# Patient Record
Sex: Female | Born: 1935
Health system: Southern US, Community
[De-identification: ages and names within clinical notes are randomized; demographics above are authoritative.]

## PROBLEM LIST (undated history)

## (undated) DIAGNOSIS — G309 Alzheimer's disease, unspecified: Secondary | ICD-10-CM

## (undated) DIAGNOSIS — F0281 Dementia in other diseases classified elsewhere with behavioral disturbance: Secondary | ICD-10-CM

## (undated) DIAGNOSIS — F419 Anxiety disorder, unspecified: Secondary | ICD-10-CM

## (undated) DIAGNOSIS — N182 Chronic kidney disease, stage 2 (mild): Secondary | ICD-10-CM

## (undated) DIAGNOSIS — F0390 Unspecified dementia without behavioral disturbance: Secondary | ICD-10-CM

## (undated) DIAGNOSIS — K529 Noninfective gastroenteritis and colitis, unspecified: Secondary | ICD-10-CM

## (undated) DIAGNOSIS — I10 Essential (primary) hypertension: Secondary | ICD-10-CM

## (undated) DIAGNOSIS — F02818 Dementia in other diseases classified elsewhere, unspecified severity, with other behavioral disturbance: Secondary | ICD-10-CM

## (undated) DIAGNOSIS — K635 Polyp of colon: Secondary | ICD-10-CM

## (undated) HISTORY — DX: Alzheimer's disease, unspecified: G30.9

## (undated) HISTORY — DX: Dementia in other diseases classified elsewhere with behavioral disturbance: F02.81

## (undated) HISTORY — DX: Dementia in other diseases classified elsewhere, unspecified severity, with other behavioral disturbance: F02.818

## (undated) HISTORY — DX: Chronic kidney disease, stage 2 (mild): N18.2

## (undated) HISTORY — DX: Unspecified dementia, unspecified severity, without behavioral disturbance, psychotic disturbance, mood disturbance, and anxiety: F03.90

## (undated) HISTORY — DX: Noninfective gastroenteritis and colitis, unspecified: K52.9

## (undated) HISTORY — DX: Anxiety disorder, unspecified: F41.9

## (undated) HISTORY — PX: TONSILLECTOMY: SUR1361

## (undated) HISTORY — DX: Polyp of colon: K63.5

## (undated) HISTORY — DX: Essential (primary) hypertension: I10

---

## 2015-05-03 DIAGNOSIS — M1711 Unilateral primary osteoarthritis, right knee: Secondary | ICD-10-CM | POA: Diagnosis not present

## 2015-07-12 DIAGNOSIS — H35371 Puckering of macula, right eye: Secondary | ICD-10-CM | POA: Diagnosis not present

## 2015-07-12 DIAGNOSIS — Z961 Presence of intraocular lens: Secondary | ICD-10-CM | POA: Diagnosis not present

## 2015-07-12 DIAGNOSIS — H2512 Age-related nuclear cataract, left eye: Secondary | ICD-10-CM | POA: Diagnosis not present

## 2015-07-12 DIAGNOSIS — H401232 Low-tension glaucoma, bilateral, moderate stage: Secondary | ICD-10-CM | POA: Diagnosis not present

## 2015-09-13 DIAGNOSIS — H2512 Age-related nuclear cataract, left eye: Secondary | ICD-10-CM | POA: Diagnosis not present

## 2015-09-13 DIAGNOSIS — Z961 Presence of intraocular lens: Secondary | ICD-10-CM | POA: Diagnosis not present

## 2015-09-13 DIAGNOSIS — H401232 Low-tension glaucoma, bilateral, moderate stage: Secondary | ICD-10-CM | POA: Diagnosis not present

## 2015-11-10 DIAGNOSIS — M1711 Unilateral primary osteoarthritis, right knee: Secondary | ICD-10-CM | POA: Diagnosis not present

## 2016-01-23 ENCOUNTER — Encounter: Payer: Self-pay | Admitting: Family Medicine

## 2016-01-23 ENCOUNTER — Ambulatory Visit (INDEPENDENT_AMBULATORY_CARE_PROVIDER_SITE_OTHER): Payer: Medicare Other | Admitting: Family Medicine

## 2016-01-23 VITALS — BP 130/80 | HR 71 | Wt 192.4 lb

## 2016-01-23 DIAGNOSIS — J3489 Other specified disorders of nose and nasal sinuses: Secondary | ICD-10-CM | POA: Insufficient documentation

## 2016-01-23 DIAGNOSIS — R197 Diarrhea, unspecified: Secondary | ICD-10-CM | POA: Diagnosis not present

## 2016-01-23 DIAGNOSIS — F0280 Dementia in other diseases classified elsewhere without behavioral disturbance: Secondary | ICD-10-CM | POA: Diagnosis not present

## 2016-01-23 DIAGNOSIS — K529 Noninfective gastroenteritis and colitis, unspecified: Secondary | ICD-10-CM | POA: Insufficient documentation

## 2016-01-23 DIAGNOSIS — I10 Essential (primary) hypertension: Secondary | ICD-10-CM | POA: Insufficient documentation

## 2016-01-23 DIAGNOSIS — F0390 Unspecified dementia without behavioral disturbance: Secondary | ICD-10-CM | POA: Insufficient documentation

## 2016-01-23 NOTE — Progress Notes (Signed)
Subjective:    Patient ID: Christina Atkinson, female    DOB: 31-Jan-1932, 82 y.o.   MRN: [id number]  HPI Chief Complaint  Patient presents with  . new pt    new pt get established. off and on diarrhea.    She is an 82 year old Caucasian female with a history of HTN and dementia who is brought here by her daughter in law to establish care. No records have been received but they did sign a medical release today. She moved here from [place] in September and has been going to Dr. Nyra Capes in [place]. Her daughter in law is a Marine scientist.  Complains of 2-3 week history of intermittent loose diarrhea. States the diarrhea has occurred on 2 occasions and appears to be related to [place] or certain foods. Has not had diarrhea in several days.  Denies fever, chills, chest pain, palpitations, DOE, abdominal pain, nausea, vomiting, or urinary symptoms.  Complains of clear nasal drainage without congestion ongoing for a couple of weeks.   She was diagnosed with dementia when she was living in [place] per her daughter in law and was wearing some sort of patch for dementia. She thinks she was diagnosed approximately 2-3 years ago.  States she does not think patient has seen a neurologist or had any recent imaging. She is taking daily Aricept prescribed by her previous PCP.   Reviewed allergies, medications, past medical,  and social history.    Review of Systems Pertinent positives and negatives in the history of present illness.     Objective:   Physical Exam  Constitutional: She appears well-developed and well-nourished. No distress.  HENT:  Right Ear: Tympanic membrane and ear canal normal.  Left Ear: Tympanic membrane and ear canal normal.  Nose: Nose normal. Right sinus exhibits no maxillary sinus tenderness and no frontal sinus tenderness. Left sinus exhibits no maxillary sinus tenderness and no frontal sinus tenderness.  Mouth/Throat: Uvula is midline, oropharynx is clear and moist and mucous membranes  are normal.  Neck: Neck supple.  Cardiovascular: Normal rate, regular rhythm and normal heart sounds.  Exam reveals no gallop and no friction rub.   No murmur heard. Pulmonary/Chest: Effort normal and breath sounds normal.  Abdominal: Soft. Normal appearance and bowel sounds are normal. There is no hepatosplenomegaly. There is no tenderness. There is no rebound and no CVA tenderness.  Lymphadenopathy:    She has no cervical adenopathy.       Right: No supraclavicular adenopathy present.       Left: No supraclavicular adenopathy present.  Neurological: She is alert. She has normal strength.  Skin: Skin is warm and dry. No rash noted. No pallor.   BP 130/80   Pulse 71   Wt 192 lb 6.4 oz (87.3 kg)      Assessment & Plan:  Essential hypertension  Dementia associated with other underlying disease without behavioral disturbance  Diarrhea, unspecified type  Rhinorrhea  Discussed that her blood pressure appears to be well controlled and to stay on current medication.  Diarrhea appears to be related to certain foods. She is hydrated and no sign of systemic illness. Exam is normal. Plan to have her pay close attention to triggers for diarrhea if this occurs again.  Rhinorrhea may be early viral illness but no other symptoms at this point. May be having allergy issues. She will let me know if symptoms worsen.  It is not clear as to how she was diagnosed with dementia or what type of  dementia she has at this point. Plan to get medical records from previous PCP.  Plan to refer to neurologist for further evaluation once I have her records.  Will have her follow up in 3 weeks and hope to have her records by then.

## 2016-02-21 DIAGNOSIS — D692 Other nonthrombocytopenic purpura: Secondary | ICD-10-CM | POA: Diagnosis not present

## 2016-02-21 DIAGNOSIS — L570 Actinic keratosis: Secondary | ICD-10-CM | POA: Diagnosis not present

## 2016-02-21 DIAGNOSIS — L853 Xerosis cutis: Secondary | ICD-10-CM | POA: Diagnosis not present

## 2016-02-21 DIAGNOSIS — L905 Scar conditions and fibrosis of skin: Secondary | ICD-10-CM | POA: Diagnosis not present

## 2016-02-21 DIAGNOSIS — L821 Other seborrheic keratosis: Secondary | ICD-10-CM | POA: Diagnosis not present

## 2016-02-21 DIAGNOSIS — D1801 Hemangioma of skin and subcutaneous tissue: Secondary | ICD-10-CM | POA: Diagnosis not present

## 2016-02-21 DIAGNOSIS — L814 Other melanin hyperpigmentation: Secondary | ICD-10-CM | POA: Diagnosis not present

## 2016-02-28 DIAGNOSIS — M1711 Unilateral primary osteoarthritis, right knee: Secondary | ICD-10-CM | POA: Diagnosis not present

## 2016-02-29 ENCOUNTER — Telehealth: Payer: Self-pay

## 2016-02-29 ENCOUNTER — Encounter: Payer: Self-pay | Admitting: Family Medicine

## 2016-02-29 DIAGNOSIS — N182 Chronic kidney disease, stage 2 (mild): Secondary | ICD-10-CM | POA: Insufficient documentation

## 2016-02-29 DIAGNOSIS — F02818 Dementia in other diseases classified elsewhere, unspecified severity, with other behavioral disturbance: Secondary | ICD-10-CM | POA: Insufficient documentation

## 2016-02-29 DIAGNOSIS — F0281 Dementia in other diseases classified elsewhere with behavioral disturbance: Secondary | ICD-10-CM | POA: Insufficient documentation

## 2016-02-29 DIAGNOSIS — G309 Alzheimer's disease, unspecified: Secondary | ICD-10-CM

## 2016-02-29 NOTE — Telephone Encounter (Signed)
Records recvd on pt from [facility] din your folder for review. Christina Atkinson

## 2016-05-05 DIAGNOSIS — J069 Acute upper respiratory infection, unspecified: Secondary | ICD-10-CM | POA: Diagnosis not present

## 2016-05-16 ENCOUNTER — Encounter: Payer: Self-pay | Admitting: Family Medicine

## 2016-05-16 ENCOUNTER — Ambulatory Visit (INDEPENDENT_AMBULATORY_CARE_PROVIDER_SITE_OTHER): Payer: Medicare Other | Admitting: Family Medicine

## 2016-05-16 VITALS — BP 140/80 | HR 65 | Temp 97.7°F | Ht 69.0 in | Wt 183.2 lb

## 2016-05-16 DIAGNOSIS — R4189 Other symptoms and signs involving cognitive functions and awareness: Secondary | ICD-10-CM | POA: Diagnosis not present

## 2016-05-16 DIAGNOSIS — F0281 Dementia in other diseases classified elsewhere with behavioral disturbance: Secondary | ICD-10-CM

## 2016-05-16 DIAGNOSIS — G308 Other Alzheimer's disease: Secondary | ICD-10-CM | POA: Diagnosis not present

## 2016-05-16 DIAGNOSIS — I10 Essential (primary) hypertension: Secondary | ICD-10-CM

## 2016-05-16 DIAGNOSIS — G309 Alzheimer's disease, unspecified: Principal | ICD-10-CM

## 2016-05-16 LAB — CBC WITH DIFFERENTIAL/PLATELET
BASOS PCT: 1 %
Basophils Absolute: 40 cells/uL (ref 0–200)
Eosinophils Absolute: 80 cells/uL (ref 15–500)
Eosinophils Relative: 2 %
HCT: 38.2 % (ref 35.0–45.0)
Hemoglobin: 12.3 g/dL (ref 11.7–15.5)
LYMPHS PCT: 21 %
Lymphs Abs: 840 cells/uL — ABNORMAL LOW (ref 850–3900)
MCH: 29.6 pg (ref 27.0–33.0)
MCHC: 32.2 g/dL (ref 32.0–36.0)
MCV: 91.8 fL (ref 80.0–100.0)
MONO ABS: 480 {cells}/uL (ref 200–950)
MONOS PCT: 12 %
MPV: 11.1 fL (ref 7.5–12.5)
NEUTROS ABS: 2560 {cells}/uL (ref 1500–7800)
Neutrophils Relative %: 64 %
PLATELETS: 218 10*3/uL (ref 140–400)
RBC: 4.16 MIL/uL (ref 3.80–5.10)
RDW: 13.8 % (ref 11.0–15.0)
WBC: 4 10*3/uL (ref 4.0–10.5)

## 2016-05-16 LAB — COMPREHENSIVE METABOLIC PANEL
ALK PHOS: 66 U/L (ref 33–130)
ALT: 12 U/L (ref 6–29)
AST: 19 U/L (ref 10–35)
Albumin: 3.3 g/dL — ABNORMAL LOW (ref 3.6–5.1)
BILIRUBIN TOTAL: 0.5 mg/dL (ref 0.2–1.2)
BUN: 19 mg/dL (ref 7–25)
CO2: 30 mmol/L (ref 20–31)
CREATININE: 0.82 mg/dL (ref 0.60–0.88)
Calcium: 9.3 mg/dL (ref 8.6–10.4)
Chloride: 106 mmol/L (ref 98–110)
GLUCOSE: 101 mg/dL — AB (ref 65–99)
Potassium: 4 mmol/L (ref 3.5–5.3)
Sodium: 143 mmol/L (ref 135–146)
Total Protein: 6.4 g/dL (ref 6.1–8.1)

## 2016-05-16 LAB — VITAMIN B12: Vitamin B-12: 718 pg/mL (ref 200–1100)

## 2016-05-16 LAB — TSH: TSH: 1.79 mIU/L

## 2016-05-16 NOTE — Patient Instructions (Signed)
Check her blood pressure 2-3 times per week and let me know if readings are >140/80 consistently.   Neurology office will call to schedule an appointment.   We will call you with lab results.   Follow up in 3 months or sooner pending labs.

## 2016-05-16 NOTE — Progress Notes (Signed)
   Subjective:    Patient ID: Christina Atkinson, female    DOB: [birth year], 83 y.o.   MRN: [id number]  HPI Chief Complaint  Patient presents with  . follow-up    follow-up from initial visit-, running nose all winter, bumps on arm,  difference in memory   She is an 83 year old caucasian female with a history of Alzheimer's dementia and HTN who is here for a follow up. She established care with me after moving in with her son and daughter in law in October. She moved here from [place]. She has not followed up since October 2017. Daughter in law is here with her. States she and her husband have noticed a decline in patient's memory and ability to function at home. States this has been a gradual worsening and nothing acute. No falls or recent injuries.  Patient reports loss of appetite but daughter in law states she is eating 3 meals daily without issues.  Patient is having difficulty going to sleep and is up at all hours of the night per daughter in law.  Blood pressure is not being checked at home. She is taking atenolol daily.   Requests SCAT forms to be filled out so that patient may go to daytime activities at [employer].   Denies fever, chills, headache, dizziness, chest pain, palpitations, cough, abdominal pain, GI or GU symptoms.   Reviewed allergies, medications, past medical,  and social history.    Review of Systems Pertinent positives and negatives in the history of present illness.     Objective:   Physical Exam BP 140/80   Pulse 65   Temp 97.7 F (36.5 C) (Oral)   Ht 5\' 9"  (1.753 m)   Wt 183 lb 3.2 oz (83.1 kg)   BMI 27.05 kg/m  Alert and in no distress. Tympanic membranes and canals are normal. Pharyngeal area is normal. Neck is supple without adenopathy or thyromegaly. Cardiac exam shows a regular sinus rhythm without murmurs or gallops. Lungs are clear to auscultation. Abdomen soft, non distended, normal BS, non tender, no masses. Extremities without edema,  normal pulses. CN II-IX intact, PERRLA, EOMs intact. Speech is inappropriate at times. Able to answer simple questions and follow commands.       Assessment & Plan:  Alzheimer's dementia with behavioral disturbance, unspecified timing of dementia onset - Plan: Ambulatory referral to Neurology  Hypertension, unspecified type - Plan: CBC with Differential/Platelet, Comprehensive metabolic panel  Cognitive changes - Plan: Ambulatory referral to Neurology, TSH, POCT urinalysis dipstick, Vitamin B12  Urinalysis dipstick: patient unable to void in the office and daughter in law will take the specimen cup home and bring it back later today.  MMSE 16/30. Family members have noticed a gradual decline in functioning and memory. Plan to refer to neurology for further evaluation and treatment.   Blood pressure - they will check it at home and let me know if readings are >140/80. Continue on current medication. Watch sodium in diet.  [employer] forms filled out and given back to daughter in law. 3 month follow up pending labs.

## 2016-05-21 ENCOUNTER — Other Ambulatory Visit (INDEPENDENT_AMBULATORY_CARE_PROVIDER_SITE_OTHER): Payer: Medicare Other

## 2016-05-21 DIAGNOSIS — R4189 Other symptoms and signs involving cognitive functions and awareness: Secondary | ICD-10-CM

## 2016-05-21 LAB — POCT URINALYSIS DIPSTICK
Bilirubin, UA: NEGATIVE
GLUCOSE UA: NEGATIVE
Ketones, UA: NEGATIVE
Leukocytes, UA: NEGATIVE
Nitrite, UA: NEGATIVE
PROTEIN UA: NEGATIVE
RBC UA: NEGATIVE
SPEC GRAV UA: 1.015
UROBILINOGEN UA: NEGATIVE
pH, UA: 7

## 2016-05-30 ENCOUNTER — Telehealth: Payer: Self-pay | Admitting: Family Medicine

## 2016-05-30 NOTE — Telephone Encounter (Signed)
Pt son dropped off SCAT form to answer a missed question as to why pt cannot use regular bus service.  Completed and faxed to [facility] [phone]

## 2016-06-13 ENCOUNTER — Telehealth: Payer: Self-pay | Admitting: Family Medicine

## 2016-06-13 MED ORDER — OSELTAMIVIR PHOSPHATE 75 MG PO CAPS: 75.0000 mg | ORAL_CAPSULE | Freq: Every day | ORAL | 0 refills | Status: DC

## 2016-06-13 NOTE — Telephone Encounter (Signed)
Spoke with Daughter in law about when grandson got flu and he was diagnosed. He was diagnosed today She is aware that med will be sent in but read some side effects and serious side effects to her to look out for

## 2016-06-13 NOTE — Telephone Encounter (Signed)
Please call and find out when her son became sick with flu symptoms. The medication is only effective for prevention if it is started within 48 hours of being exposed. Also, the medication may have side effects such as nausea, headache, diarrhea and while more serious side effects are very rare, there is the potential for serious side effects such as behavior disturbances and hallucinations.  I will call it in if she was exposed within the 48 hour time period and they are aware of the possible side effects.

## 2016-06-13 NOTE — Telephone Encounter (Signed)
Pt son called and wants to know if imala can get tamilfu her grandson has been diagnosed with flu b and they are wanting her to get the tamiflu has a preventive for her since she has been around him, pt uses  [facility], [address]

## 2016-06-14 ENCOUNTER — Ambulatory Visit (INDEPENDENT_AMBULATORY_CARE_PROVIDER_SITE_OTHER): Payer: Medicare Other | Admitting: Neurology

## 2016-06-14 ENCOUNTER — Encounter: Payer: Self-pay | Admitting: Neurology

## 2016-06-14 VITALS — BP 173/84 | HR 52 | Resp 20 | Ht 69.0 in | Wt 179.0 lb

## 2016-06-14 DIAGNOSIS — F0280 Dementia in other diseases classified elsewhere without behavioral disturbance: Secondary | ICD-10-CM | POA: Diagnosis not present

## 2016-06-14 DIAGNOSIS — G301 Alzheimer's disease with late onset: Secondary | ICD-10-CM

## 2016-06-14 MED ORDER — MEMANTINE HCL ER 7 MG PO CP24: 7.0000 mg | ORAL_CAPSULE | Freq: Every day | ORAL | 3 refills | Status: DC

## 2016-06-14 NOTE — Progress Notes (Signed)
Subjective:    Patient ID: Christina Atkinson is a 83 y.o. female.  HPI     Star Age, MD, PhD [facility] [address].O. [place],  [address]    Dear Loletha Carrow,   I saw your patient, Christina Atkinson, upon your kind request in my neurologic clinic today for initial consultation of her dementia. The patient is accompanied by her daughter-in-law today. As you know, Christina Atkinson is an 83 year old right-handed woman with an underlying medical history of hypertension, chronic kidney disease, and overweight state, who was diagnosed with Alzheimer's dementia some 3 years ago with symptoms predating this by some time, unclear exact duration a she was residing in [place] at the time and she had seen a doctor in [place]. She has been on Aricept generic for at least 3 years. She has had progressive memory decline. She has no overt behavioral disturbances, no tendency towards aggression or violence or anger, but she has been more anxious in the past year and a half and also perhaps more depressed. She has not been on an antidepressant oriented times ID medication. She moved from [place] a year and a half ago. She lives with her youngest son and his family which includes daughter-in-law and 4 children. She has another daughter in town who takes her to dinner once a week and also keeps the patient 1 week per month and she has another son in town who has started being more involved in her care and her oldest son lives in [place]. Her daughter-in-law provides most of the history. She's not aware of any family history of dementia with the exception of one younger sister of the patient has been diagnosed with Alzheimer's disease. She is younger by about 8 years. The patient is the oldest of 49 total siblings, she has 2 sisters and one brother. She quit smoking 30-40 years ago. She drinks quite a bit of coffee, 4-5 cups per day estimated by her daughter-in-law. Patient denies  drinking that much coffee. She does not drink very much water. She has had diarrhea at least for the past 6 months and also significant weight loss. She's not a good eater. She has lost over 12 pounds in the last 3 months. Prior to that she lost weight when she was living in [place] with her husband, who passed away in 2015-12-12.  I reviewed your office note from 05/16/2016. She has never had a brain MRI or head CT from what I understand. She no longer drives, has not driven in over 2 years. She goes to an exercise class about 3 days a week through [facility].  Her Past Medical History Is Significant For: Past Medical History:  Diagnosis Date  . Alzheimer's dementia with behavioral disturbance    per Dr. Marco Collie 08/2015  . CKD (chronic kidney disease), stage II    per medical record per Dr. Marco Collie   . Dementia   . HTN (hypertension)     Her Past Surgical History Is Significant For: No past surgical history on file.  Her Family History Is Significant For: No family history on file.  Her Social History Is Significant For: Social History   Social History  . Marital status: Widowed    Spouse name: N/A  . Number of children: N/A  . Years of education: N/A   Social History Main Topics  . Smoking status: Never Smoker  . Smokeless tobacco: Never Used  . Alcohol use No  .  Drug use: No  . Sexual activity: Not Asked   Other Topics Concern  . None   Social History Narrative  . None    Her Allergies Are:  Allergies  Allergen Reactions  . Codeine   :   Her Current Medications Are:  Outpatient Encounter Prescriptions as of 06/14/2016  Medication Sig  . atenolol (TENORMIN) 100 MG tablet 1 tablet daily.  Marland Kitchen donepezil (ARICEPT) 10 MG tablet 1 tablet daily.  . Misc Natural Products (GLUCOS-CHONDROIT-MSM COMPLEX PO) Take 1 tablet by mouth 2 (two) times daily.  . Multiple Vitamins-Minerals (CENTRUM SILVER PO) Take by mouth.  . oseltamivir (TAMIFLU) 75 MG capsule Take 1  capsule (75 mg total) by mouth daily.   No facility-administered encounter medications on file as of 06/14/2016.   : Review of Systems:  Out of a complete 14 point review of systems, all are reviewed and negative with the exception of these symptoms as listed below:  Review of Systems  Neurological:       Pt presents today to discuss her memory. She reports that she used to drive but cannot handle that any longer.    Objective:  Neurologic Exam  Physical Exam Physical Examination:   Vitals:   06/14/16 1007  BP: (!) 173/84  Pulse: (!) 52  Resp: 20   General Examination: The patient is a very pleasant 83 y.o. female in no acute distress. She appears well-developed and well-nourished and well groomed. She is mildly anxious appearing.  HEENT: Normocephalic, atraumatic, pupils are equal, round and reactive to light and accommodation. Extraocular tracking is mildly difficult for her. No nystagmus is noted. Hearing is grossly intact. Face is symmetric with normal facial animation and normal facial sensation. Speech is clear with no dysarthria noted. There is no hypophonia. There is no lip, neck/head, jaw or voice tremor. Neck is supple with full range of passive and active motion. There are no carotid bruits on auscultation. Oropharynx exam reveals: mild mouth dryness, tongue protrudes centrally and palate elevates symmetrically.   Chest: Clear to auscultation without wheezing, rhonchi or crackles noted.  Heart: S1+S2+0, regular and normal without murmurs, rubs or gallops noted.   Abdomen: Soft, non-tender and non-distended with normal bowel sounds appreciated on auscultation.  Extremities: There is no pitting edema in the distal lower extremities bilaterally. Pedal pulses are intact.  Skin: Warm and dry without trophic changes noted.  Musculoskeletal: exam reveals difficulty both knees, right more than left,   Neurologically:  Mental status: The patient is awake, but not fully  oriented, only to self. Her immediate and remote memory, attention, language skills and fund of knowledge are abnormal.   On 06/14/2016: MMSE: 13/30 (she lost 10 points on orientation, 3 points on spelling world backwards, 3 points on remote recand one point on repetition), CDT: 1/4, AFT: 4/min.  Speech is clear with normal prosody and enunciation. Thought process is linear. Mood is normal and affect is constricted.  Cranial nerves II - XII are as described above under HEENT exam. In addition: shoulder shrug is normal with equal shoulder height noted. Motor exam: Normal bulk, strength and tone is noted. There is no drift, tremor or rebound. Romberg is not testable safely. Reflexes are 1+ throughout. Fine motor skills are globally mildly impaired.  Cerebellar testing: No dysmetria or intention tremor.  Sensory exam: intact to light touch in the upper and lower extremities bilaterally. Gait, station and balance: She stands with difficulty and has to push herself up, she does not pressure  right knee fully through and has a limp on the right, she has no walking aid. She has a mild to moderately stooped posture. Tandem walk is not possible safely.   Assessment and Plan:   In summary, Christina Atkinson is a very pleasant 83 y.o.-year old female with an underlying medical history of hypertension and arthritis, who has at least 3 year history of memory loss, history and physical exam are in keeping with Alzheimer's dementia without behavioral disturbance, she has had some recent anxiety and depressive symptoms. She is encouraged to discuss this with you at the next visit. She has had some weight loss and appetite loss. The should be monitored. Aricept can cause diarrhea and she has had had some diarrhea intermittently as I understand at least for the past 6 months. Her daughter-in-law is encouraged to try to monitor for this. She has a good support system, her children are involved in her care. She is encouraged to  continue with generic Aricept. I would like to proceed with brain MRI testing and we will call with results. We talked about healthy lifestyle in the importance of daily exercise and better hydration. She is advised to try to reduce her coffee intake and limit herself to 2-3 cups per day and increase her water intake. Furthermore, I would like to start her on Namenda long-acting 7 mg once daily and we will monitor her memory scores and not do a two-month checkup at which time we will consider increasing the Namenda to 14 mg once daily. We will also monitor her weight at the time.   I had a long chat with the patient and particularly [name], her daughter-in-law about my findings and the diagnosis of memory loss and dementia, its prognosis and treatment options. Implications of diagnosis explained at length with the patient and caregiver. We talked about medical treatments and non-pharmacological approaches. We talked about healthy lifestyle in general and staying active mentally and physically.  I answered all their questions today and the patient and [name] were in agreement with the above outlined plan. Thank you very much for allowing me to participate in the care of this nice patient. If I can be of any further assistance to you please do not hesitate to call me at [phone].  Sincerely,   Star Age, MD, PhD

## 2016-06-14 NOTE — Patient Instructions (Addendum)
We will continue with your generic Aricept 10 mg daily.   I would like to suggest starting you on a second memory medication, Namenda XR, starting at 7 mg once daily with gradual build up. Side effects include: nausea, confusion, hallucination, personality changes. If you are having mild side effects, try to stick with the treatment as these initial side effects may go away after the first 10-14 days.     Let's do a 2 mo check up at which time we will check weight, consider increasing the Namenda XR to 14mg . Monitor for diarrhea.   We will do a brain scan, called MRI and call you with the test results. We will have to schedule you for this on a separate date. This test requires authorization from your insurance, and we will take care of the insurance process.

## 2016-07-04 ENCOUNTER — Ambulatory Visit
Admission: RE | Admit: 2016-07-04 | Discharge: 2016-07-04 | Disposition: A | Discharge status: Home / Self Care | Payer: Medicare Other | Source: Ambulatory Visit | Attending: Neurology | Admitting: Neurology

## 2016-07-04 ENCOUNTER — Telehealth: Payer: Self-pay | Admitting: Neurology

## 2016-07-04 DIAGNOSIS — G301 Alzheimer's disease with late onset: Principal | ICD-10-CM

## 2016-07-04 DIAGNOSIS — F0280 Dementia in other diseases classified elsewhere without behavioral disturbance: Secondary | ICD-10-CM

## 2016-07-04 NOTE — Telephone Encounter (Signed)
Called to advise they worked with patient for 45 minutes with no pictures due to patient having a constant cough.  Family stated they will call back to reschedule once patient is better. FYI

## 2016-07-11 DIAGNOSIS — H401132 Primary open-angle glaucoma, bilateral, moderate stage: Secondary | ICD-10-CM | POA: Diagnosis not present

## 2016-07-11 DIAGNOSIS — M1711 Unilateral primary osteoarthritis, right knee: Secondary | ICD-10-CM | POA: Diagnosis not present

## 2016-07-11 DIAGNOSIS — M25512 Pain in left shoulder: Secondary | ICD-10-CM | POA: Diagnosis not present

## 2016-07-11 DIAGNOSIS — G8929 Other chronic pain: Secondary | ICD-10-CM | POA: Diagnosis not present

## 2016-07-25 ENCOUNTER — Other Ambulatory Visit: Payer: Self-pay | Admitting: Family Medicine

## 2016-08-15 ENCOUNTER — Encounter: Payer: Self-pay | Admitting: Family Medicine

## 2016-08-15 ENCOUNTER — Encounter: Payer: Self-pay | Admitting: Gastroenterology

## 2016-08-15 ENCOUNTER — Ambulatory Visit (INDEPENDENT_AMBULATORY_CARE_PROVIDER_SITE_OTHER): Payer: Medicare Other | Admitting: Family Medicine

## 2016-08-15 VITALS — BP 150/80 | HR 69 | Wt 176.6 lb

## 2016-08-15 DIAGNOSIS — I10 Essential (primary) hypertension: Secondary | ICD-10-CM

## 2016-08-15 DIAGNOSIS — R634 Abnormal weight loss: Secondary | ICD-10-CM

## 2016-08-15 DIAGNOSIS — Z86010 Personal history of colonic polyps: Secondary | ICD-10-CM | POA: Insufficient documentation

## 2016-08-15 DIAGNOSIS — K529 Noninfective gastroenteritis and colitis, unspecified: Secondary | ICD-10-CM

## 2016-08-15 MED ORDER — ATENOLOL-CHLORTHALIDONE 100-25 MG PO TABS: 1.0000 | ORAL_TABLET | Freq: Every day | ORAL | 2 refills | Status: DC

## 2016-08-15 NOTE — Progress Notes (Signed)
Subjective:    Patient ID: Christina Atkinson, female    DOB: 08/09/1931, 83 y.o.   MRN: [id number]  HPI Chief Complaint  Patient presents with  . 3 month follow-up    3 month follow-up. keeps losing weight but still eating well   She is here with her daughter in law for follow up on HTN and weight loss.  She is going to a day care program at [facility] 3 days per week.   Blood pressure at home has been in the 150s/80s and sometimes in the diastolic is 90.   Daughter in law is concerned about patient's weight loss. States she had lost approximately 25 lbs prior to October 2017 and since then she has lost 16 lbs States she appears to have a good appetite and eats 3 meals per day. Eats all of her breakfast. Half a sandwich for lunch usually. Eats most of her supper. Does not snack typically. Is not very active.  They would also like to discuss that she has had diarrhea, ongoing for the past year. States it is intermittent and is not worsening. Family questions medication as a cause but she has been on the medication for longer than the diarrhea.  She does have entire month without any diarrhea and then it recurs.  The patient states she thinks it is related to certain foods.  She denies having fever, chills, abdominal pain, N/V, urinary symptoms.  Last colonoscopy is unknown. We were unable to get medical records from [place]. Daughter in law does report that patient has history of polyps in her colon.   Recent visit to neurologist for Alzheimer's dementia and Namenda was added to her current Aricept regimen.    States she worries a lot. Feels anxious at times. Does not feel depressed. Sleeping well. No visual or auditory hallucinations. Daughter in law has no concerns about patient's mood or behavior.     Depression screen PHQ 2/9 08/15/2016  Decreased Interest 0  Down, Depressed, Hopeless 0  PHQ - 2 Score 0    Reviewed allergies, medications, past medical, surgical, and social  history.   Review of Systems Pertinent positives and negatives in the history of present illness.     Objective:   Physical Exam  Constitutional: She appears well-developed and well-nourished. She does not have a sickly appearance. No distress.  HENT:  Mouth/Throat: Uvula is midline, oropharynx is clear and moist and mucous membranes are normal.  Eyes: Conjunctivae and lids are normal. Pupils are equal, round, and reactive to light.  Neck: Normal range of motion. Neck supple. No JVD present. No thyromegaly present.  Cardiovascular: Normal rate and regular rhythm.   No LE edema  Pulmonary/Chest: Effort normal and breath sounds normal.  Abdominal: Soft. Normal appearance and bowel sounds are normal. There is no hepatosplenomegaly. There is no tenderness. There is no rigidity, no rebound, no guarding, no CVA tenderness, no tenderness at McBurney's point and negative Murphy's sign.  Lymphadenopathy:    She has no cervical adenopathy.       Right: No supraclavicular adenopathy present.       Left: No supraclavicular adenopathy present.  Neurological: She is alert. She has normal strength. No cranial nerve deficit or sensory deficit. Coordination and gait normal.  Skin: Skin is warm and dry. She is not diaphoretic. No pallor.  Psychiatric: She has a normal mood and affect. Her speech is normal.   BP (!) 150/80   Pulse 69   Wt 176 lb 9.6  oz (80.1 kg)   BMI 26.08 kg/m       Assessment & Plan:  Hypertension, unspecified type - Plan: atenolol-chlorthalidone (TENORETIC) 100-25 MG tablet  History of colon polyps - Plan: Ambulatory referral to Gastroenterology  Chronic diarrhea of unknown origin - Plan: Ambulatory referral to Gastroenterology  Weight loss, unintentional - Plan: Ambulatory referral to Gastroenterology  HTN- BP is not well controlled. Plan to have her stop Atenolol and start Tenoretic. Daughter in law will check her BP daily and report back any issues or concerns.  Discussed getting her SBP in the 130s would be fine.  Depression screening is negative.  Diarrhea has been intermittent and does not appear to be worsening, unknown origin. Cannot rule out medication as possible etiology but per daughter in law states the diarrhea started much later than any of her medications. No obvious infectious.  Weight loss is concerning. Family reports patient does have a good appetite and is eating 3 meals per day. Plan to add ensure once daily and have her back for a weight check in [redacted] weeks along with HTN follow up.  Stool cards sent home with patient. Plan to refer to GI for further evaluation of weight loss.  Last colonoscopy is unknown but family does report colonic polyps in patient. No screening tests or health maintenance results available from [place].

## 2016-08-15 NOTE — Patient Instructions (Signed)
Let's add one Ensure daily.   Stop the Atenolol and start the combination medication.  Keep an eye on her BP daily.  Follow up in 3 weeks for BP and weight.   The GI office will call you to schedule an appointment.

## 2016-08-16 ENCOUNTER — Telehealth: Payer: Self-pay | Admitting: Neurology

## 2016-08-16 ENCOUNTER — Ambulatory Visit: Payer: Medicare Other | Admitting: Nurse Practitioner

## 2016-08-16 DIAGNOSIS — G301 Alzheimer's disease with late onset: Principal | ICD-10-CM

## 2016-08-16 DIAGNOSIS — F0280 Dementia in other diseases classified elsewhere without behavioral disturbance: Secondary | ICD-10-CM

## 2016-08-16 NOTE — Addendum Note (Signed)
Addended by: Lester Canfield A on: 08/16/2016 09:15 AM   Modules accepted: Orders

## 2016-08-16 NOTE — Telephone Encounter (Signed)
Please reorder MRI brain w/out patient could not complete exam originally due to cough.

## 2016-08-16 NOTE — Telephone Encounter (Signed)
Fax MRI order to GI

## 2016-09-03 ENCOUNTER — Ambulatory Visit
Admission: RE | Admit: 2016-09-03 | Discharge: 2016-09-03 | Disposition: A | Discharge status: Home / Self Care | Payer: Medicare Other | Source: Ambulatory Visit | Attending: Neurology | Admitting: Neurology

## 2016-09-03 DIAGNOSIS — G301 Alzheimer's disease with late onset: Secondary | ICD-10-CM | POA: Diagnosis not present

## 2016-09-03 DIAGNOSIS — F0280 Dementia in other diseases classified elsewhere without behavioral disturbance: Secondary | ICD-10-CM

## 2016-09-03 NOTE — Progress Notes (Signed)
Please call patient regarding the recent brain MRI: The brain scan showed a normal structure of the brain and mild volume loss which we call atrophy. There were changes in the deeper structures of the brain, which we call white matter changes or microvascular changes. These were reported as mild in her case. These are tiny white spots, that occur with time and are seen in a variety of conditions, including with normal aging, chronic hypertension, chronic headaches, especially migraine HAs, chronic diabetes, chronic hyperlipidemia. These are not strokes and no mass or lesion or were seen which is reassuring. Again, there were no acute findings, such as a stroke, or mass or blood products. No further action is required on this test at this time, other than re-enforcing the importance of good blood pressure control, good cholesterol control, good blood sugar control, and weight management. Please remind patient to keep any upcoming appointments or tests and to call us with any interim questions, concerns, problems or updates. Thanks,  Star Age, MD, PhD

## 2016-09-06 ENCOUNTER — Telehealth: Payer: Self-pay

## 2016-09-06 ENCOUNTER — Ambulatory Visit: Payer: Medicare Other | Admitting: Family Medicine

## 2016-09-06 NOTE — Telephone Encounter (Signed)
LM for patient to call back.

## 2016-09-06 NOTE — Telephone Encounter (Signed)
-----   Message from Star Age, MD sent at 09/03/2016  4:44 PM EDT ----- Please call patient regarding the recent brain MRI: The brain scan showed a normal structure of the brain and mild volume loss which we call atrophy. There were changes in the deeper structures of the brain, which we call white matter changes or microvascular changes. These were reported as mild in her case. These are tiny white spots, that occur with time and are seen in a variety of conditions, including with normal aging, chronic hypertension, chronic headaches, especially migraine HAs, chronic diabetes, chronic hyperlipidemia. These are not strokes and no mass or lesion or were seen which is reassuring. Again, there were no acute findings, such as a stroke, or mass or blood products. No further action is required on this test at this time, other than re-enforcing the importance of good blood pressure control, good cholesterol control, good blood sugar control, and weight management. Please remind patient to keep any upcoming appointments or tests and to call us with any interim questions, concerns, problems or updates. Thanks,  Star Age, MD, PhD

## 2016-09-12 NOTE — Telephone Encounter (Signed)
I spoke to daughter-in-law (on [place]). She is aware of results and recommendations below. We rescheduled f/u.

## 2016-09-13 ENCOUNTER — Ambulatory Visit (INDEPENDENT_AMBULATORY_CARE_PROVIDER_SITE_OTHER): Payer: Medicare Other | Admitting: Gastroenterology

## 2016-09-13 ENCOUNTER — Encounter: Payer: Self-pay | Admitting: Gastroenterology

## 2016-09-13 VITALS — BP 122/70 | HR 64 | Ht 67.5 in | Wt 171.1 lb

## 2016-09-13 DIAGNOSIS — K529 Noninfective gastroenteritis and colitis, unspecified: Secondary | ICD-10-CM | POA: Diagnosis not present

## 2016-09-13 DIAGNOSIS — R198 Other specified symptoms and signs involving the digestive system and abdomen: Secondary | ICD-10-CM | POA: Diagnosis not present

## 2016-09-13 DIAGNOSIS — R634 Abnormal weight loss: Secondary | ICD-10-CM

## 2016-09-13 MED ORDER — NA SULFATE-K SULFATE-MG SULF 17.5-3.13-1.6 GM/177ML PO SOLN: 1.0000 | Freq: Once | ORAL | 0 refills | Status: AC

## 2016-09-13 NOTE — Progress Notes (Signed)
[facility] Consult Note:  History: Christina Atkinson 09/13/2016  Referring physician: Girtha Rm, NP-C  Reason for consult/chief complaint: Diarrhea (intermittent x 1 year, watery at times) and fecal frequency   Subjective  HPI:  This is an 83 year old woman referred by the above primary care provider for chronic diarrhea and weight loss. She is a limited historian due to her dementia, her daughter-in-law accompanies her and assists with the history. Christina Atkinson has had dementia for several years, and came to live with her son and daughter-in-law almost 2 years ago. She had been losing some weight prior to that, but they attributed it to lack of eating. For at least the last year she has had intermittent loose nonbloody stool. They will occur a few days a week, often be mostly in the morning and with associated urgency. She seems to deny abdominal pain other than some lower abdominal bloating and urgency before BMs. There has been no nausea or vomiting, and her appetite seems to be very good, finishing at least 75% of meals. The diarrhea also predates use of both Aricept and Namenda. It is also worrisome that she has lost a significant amount of weight within the last year. Her primary care records in [place] to last October, and she is down 20 pounds since then. She has never been a regular alcohol user or had a known history of pancreatitis. There was no recollection of antibiotics prior to the onset of symptoms. She has had no intestinal surgery or other apparent risk factors for bacterial overgrowth. She does not take a PPI, and uses Aleve perhaps once a week for right knee pain. ROS:  Review of Systems  Constitutional: Positive for unexpected weight change. Negative for appetite change.  HENT: Negative for mouth sores and voice change.   Eyes: Negative for pain and redness.  Respiratory: Negative for cough and shortness of breath.   Cardiovascular: Negative for  chest pain and palpitations.  Genitourinary: Negative for dysuria and hematuria.  Musculoskeletal: Positive for arthralgias. Negative for myalgias.  Skin: Negative for pallor and rash.  Neurological: Negative for weakness and headaches.  Hematological: Negative for adenopathy.     Past Medical History: Past Medical History:  Diagnosis Date  . Alzheimer's dementia with behavioral disturbance    per Dr. Marco Collie 08/2015  . Anxiety   . Chronic diarrhea   . CKD (chronic kidney disease), stage II    per medical record per Dr. Marco Collie   . Colon polyps   . Dementia   . HTN (hypertension)      Past Surgical History: Past Surgical History:  Procedure Laterality Date  . CESAREAN SECTION    . TONSILLECTOMY       Family History: Family History  Problem Relation Age of Onset  . Heart attack Father   . Alzheimer's disease Sister     Social History: Social History   Social History  . Marital status: Widowed    Spouse name: N/A  . Number of children: 4  . Years of education: N/A   Occupational History  . retired    Social History Main Topics  . Smoking status: Former Smoker    Quit date: 09/14/1986  . Smokeless tobacco: Never Used  . Alcohol use No  . Drug use: No  . Sexual activity: Not Asked   Other Topics Concern  . None   Social History Narrative  . None    Allergies: Allergies  Allergen Reactions  . Codeine  Outpatient Meds: Current Outpatient Prescriptions  Medication Sig Dispense Refill  . atenolol-chlorthalidone (TENORETIC) 100-25 MG tablet Take 1 tablet by mouth daily. 30 tablet 2  . donepezil (ARICEPT) 10 MG tablet Take 1 tablet by mouth daily.   1  . memantine (NAMENDA XR) 7 MG CP24 24 hr capsule Take 1 capsule (7 mg total) by mouth daily. 30 capsule 3  . Misc Natural Products (GLUCOS-CHONDROIT-MSM COMPLEX PO) Take 1 tablet by mouth 2 (two) times daily.    . Multiple Vitamins-Minerals (CENTRUM SILVER PO) Take by mouth.    .  Tetrahydrozoline HCl (EYE DROPS OP) Apply to eye. From eye doctor    . Na Sulfate-K Sulfate-Mg Sulf 17.5-3.13-1.6 GM/180ML SOLN Take 1 kit by mouth once. 354 mL 0   No current facility-administered medications for this visit.       ___________________________________________________________________ Objective   Exam:  BP 122/70 (BP Location: Left Arm, Patient Position: Sitting, Cuff Size: Normal)   Pulse 64   Ht 5' 7.5" (1.715 m) Comment: height measured without shoes  Wt 171 lb 2 oz (77.6 kg)   BMI 26.41 kg/m    General: this is a(n) Well-appearing, pleasant elderly woman. She is in no acute distress, she is ambulatory and gets on the exam table without assistance. No muscle wasting.   Eyes: sclera anicteric, no redness  ENT: oral mucosa moist without lesions, no cervical or supraclavicular lymphadenopathy, good dentition  CV: RRR without murmur, S1/S2, no JVD, no peripheral edema  Resp: clear to auscultation bilaterally, normal RR and effort noted  GI: soft, no tenderness, with active bowel sounds. No guarding or palpable organomegaly noted.  Skin; warm and dry, no rash or jaundice noted  Neuro: awake, alert and oriented x 3. Normal gross motor function and fluent speech Varicose veins  Labs:  CMP Latest Ref Rng & Units 05/16/2016  Glucose 65 - 99 mg/dL 101(H)  BUN 7 - 25 mg/dL 19  Creatinine 0.60 - 0.88 mg/dL 0.82  Sodium 135 - 146 mmol/L 143  Potassium 3.5 - 5.3 mmol/L 4.0  Chloride 98 - 110 mmol/L 106  CO2 20 - 31 mmol/L 30  Calcium 8.6 - 10.4 mg/dL 9.3  Total Protein 6.1 - 8.1 g/dL 6.4  Total Bilirubin 0.2 - 1.2 mg/dL 0.5  Alkaline Phos 33 - 130 U/L 66  AST 10 - 35 U/L 19  ALT 6 - 29 U/L 12   Albumin 3.3  CBC Latest Ref Rng & Units 05/16/2016  WBC 4.0 - 10.5 K/uL 4.0  Hemoglobin 11.7 - 15.5 g/dL 12.3  Hematocrit 35.0 - 45.0 % 38.2  Platelets 140 - 400 K/uL 218   nml TSH Jan, 18  No radiologic reports  Assessment: Encounter Diagnoses  Name  Primary?  . Chronic diarrhea of unknown origin   . Abnormal loss of weight Yes  . Tenesmus   The nature of the diarrhea is unclear at present. If the weight loss is related to it, this raises concern about possible malabsorptive conditions. Primary care and given orders for some stool infectious studies, but Dorna's daughter-in-law reports it was logistically unfeasible to collect the specimen. She has no apparent risk factors for bacterial overgrowth, it will be unusual for gluten intolerance to reveal itself at this age. Overt or microscopic colitis are certainly considerations, though weight loss is atypical for that.  Plan:  As needed Imodium, typically one tablet on mornings when she has frequent loose stool.  EGD and colonoscopy. They are agreeable  The benefits and  risks of the planned procedure were described in detail with the patient or (when appropriate) their health care proxy.  Risks were outlined as including, but not limited to, bleeding, infection, perforation, adverse medication reaction leading to cardiac or pulmonary decompensation, or pancreatitis (if ERCP).  The limitation of incomplete mucosal visualization was also discussed.  No guarantees or warranties were given.   Thank you for the courtesy of this consult.  Please call me with any questions or concerns.  Nelida Meuse III  CC: Girtha Rm, NP-C

## 2016-09-13 NOTE — Patient Instructions (Signed)
If you are age 81 or older, your body mass index should be between 23-30. Your Body mass index is 26.41 kg/m. If this is out of the aforementioned range listed, please consider follow up with your Primary Care Provider.  If you are age 81 or younger, your body mass index should be between 19-25. Your Body mass index is 26.41 kg/m. If this is out of the aformentioned range listed, please consider follow up with your Primary Care Provider.   You have been scheduled for an endoscopy and colonoscopy. Please follow the written instructions given to you at your visit today. Please pick up your prep supplies at the pharmacy within the next 1-3 days. If you use inhalers (even only as needed), please bring them with you on the day of your procedure. Your physician has requested that you go to www.startemmi.com and enter the access code given to you at your visit today. This web site gives a general overview about your procedure. However, you should still follow specific instructions given to you by our office regarding your preparation for the procedure.  Thank you for choosing [facility]  Dr Wilfrid Lund III

## 2016-09-18 ENCOUNTER — Telehealth: Payer: Self-pay

## 2016-09-18 DIAGNOSIS — I10 Essential (primary) hypertension: Secondary | ICD-10-CM

## 2016-09-18 DIAGNOSIS — G301 Alzheimer's disease with late onset: Principal | ICD-10-CM

## 2016-09-18 DIAGNOSIS — F0280 Dementia in other diseases classified elsewhere without behavioral disturbance: Secondary | ICD-10-CM

## 2016-09-18 MED ORDER — ATENOLOL-CHLORTHALIDONE 100-25 MG PO TABS: 1.0000 | ORAL_TABLET | Freq: Every day | ORAL | 2 refills | Status: DC

## 2016-09-18 MED ORDER — MEMANTINE HCL ER 7 MG PO CP24: 7.0000 mg | ORAL_CAPSULE | Freq: Every day | ORAL | 0 refills | Status: DC

## 2016-09-18 NOTE — Telephone Encounter (Signed)
Refilled med

## 2016-09-18 NOTE — Telephone Encounter (Signed)
Pt needs refills of atenolol-chlorthalidone to [facility] [address].

## 2016-09-18 NOTE — Telephone Encounter (Signed)
90 day refill request received

## 2016-09-19 ENCOUNTER — Telehealth: Payer: Self-pay

## 2016-09-19 NOTE — Telephone Encounter (Signed)
Pt needs atenolol and chlorthalidone called into [facility] [address]. Victorino December

## 2016-09-19 NOTE — Telephone Encounter (Signed)
This was already done yesterday in another telephone call.

## 2016-09-27 ENCOUNTER — Telehealth: Payer: Self-pay

## 2016-09-27 DIAGNOSIS — I10 Essential (primary) hypertension: Secondary | ICD-10-CM

## 2016-09-27 MED ORDER — ATENOLOL-CHLORTHALIDONE 100-25 MG PO TABS: 1.0000 | ORAL_TABLET | Freq: Every day | ORAL | 0 refills | Status: DC

## 2016-09-27 NOTE — Telephone Encounter (Signed)
I failed to mention 90 day supply please.

## 2016-09-27 NOTE — Telephone Encounter (Signed)
This was already done on 09/18/16 # 30 with 2 refills to the [facility] [place] [facility] [address].

## 2016-09-27 NOTE — Telephone Encounter (Signed)
Pt needs refill of atenolol/[facility]orthalidone [facility]

## 2016-09-27 NOTE — Telephone Encounter (Signed)
done

## 2016-10-04 ENCOUNTER — Ambulatory Visit (AMBULATORY_SURGERY_CENTER): Payer: Medicare Other | Admitting: Gastroenterology

## 2016-10-04 ENCOUNTER — Encounter: Payer: Self-pay | Admitting: Gastroenterology

## 2016-10-04 VITALS — BP 132/66 | HR 70 | Temp 98.6°F | Resp 15 | Ht 67.5 in | Wt 171.0 lb

## 2016-10-04 DIAGNOSIS — R634 Abnormal weight loss: Secondary | ICD-10-CM | POA: Diagnosis not present

## 2016-10-04 DIAGNOSIS — I10 Essential (primary) hypertension: Secondary | ICD-10-CM | POA: Diagnosis not present

## 2016-10-04 DIAGNOSIS — R197 Diarrhea, unspecified: Secondary | ICD-10-CM | POA: Diagnosis not present

## 2016-10-04 DIAGNOSIS — G309 Alzheimer's disease, unspecified: Secondary | ICD-10-CM | POA: Diagnosis not present

## 2016-10-04 DIAGNOSIS — K529 Noninfective gastroenteritis and colitis, unspecified: Secondary | ICD-10-CM

## 2016-10-04 MED ORDER — SODIUM CHLORIDE 0.9 % IV SOLN: 500.0000 mL | INTRAVENOUS | Status: AC

## 2016-10-04 NOTE — Patient Instructions (Signed)
YOU HAD AN ENDOSCOPIC PROCEDURE TODAY AT THE [facility]:   Refer to the procedure report that was given to you for any specific questions about what was found during the examination.  If the procedure report does not answer your questions, please call your gastroenterologist to clarify.  If you requested that your care partner not be given the details of your procedure findings, then the procedure report has been included in a sealed envelope for you to review at your convenience later.  YOU SHOULD EXPECT: Some feelings of bloating in the abdomen. Passage of more gas than usual.  Walking can help get rid of the air that was put into your GI tract during the procedure and reduce the bloating. If you had a lower endoscopy (such as a colonoscopy or flexible sigmoidoscopy) you may notice spotting of blood in your stool or on the toilet paper. If you underwent a bowel prep for your procedure, you may not have a normal bowel movement for a few days.  Please Note:  You might notice some irritation and congestion in your nose or some drainage.  This is from the oxygen used during your procedure.  There is no need for concern and it should clear up in a day or so.  SYMPTOMS TO REPORT IMMEDIATELY:   Following lower endoscopy (colonoscopy or flexible sigmoidoscopy):  Excessive amounts of blood in the stool  Significant tenderness or worsening of abdominal pains  Swelling of the abdomen that is new, acute  Fever of 100F or higher   Following upper endoscopy (EGD)  Vomiting of blood or coffee ground material  New chest pain or pain under the shoulder blades  Painful or persistently difficult swallowing  New shortness of breath  Fever of 100F or higher  Black, tarry-looking stools  For urgent or emergent issues, a gastroenterologist can be reached at any hour by calling [phone].   DIET:  We do recommend a small meal at first, but then you may proceed to your regular diet.  Drink  plenty of fluids but you should avoid alcoholic beverages for 24 hours.  MEDICATIONS: Continue present medications.   ACTIVITY:  You should plan to take it easy for the rest of today and you should NOT DRIVE or use heavy machinery until tomorrow (because of the sedation medicines used during the test).    FOLLOW UP: Our staff will call the number listed on your records the next business day following your procedure to check on you and address any questions or concerns that you may have regarding the information given to you following your procedure. If we do not reach you, we will leave a message.  However, if you are feeling well and you are not experiencing any problems, there is no need to return our call.  We will assume that you have returned to your regular daily activities without incident.  If any biopsies were taken you will be contacted by phone or by letter within the next 1-3 weeks.  Please call us at [phone] if you have not heard about the biopsies in 3 weeks.   Thank you for allowing us to provide for your healthcare needs today.  SIGNATURES/CONFIDENTIALITY: You and/or your care partner have signed paperwork which will be entered into your electronic medical record.  These signatures attest to the fact that that the information above on your After Visit Summary has been reviewed and is understood.  Full responsibility of the confidentiality of this discharge information   lies with you and/or your care-partner.

## 2016-10-04 NOTE — Progress Notes (Signed)
Report to PACU, RN, vss, BBS= Clear.  

## 2016-10-04 NOTE — Op Note (Signed)
[facility] Patient Name: Avi Archuleta Procedure Date: 10/04/2016 3:32 PM MRN: [id number] Endoscopist: Mallie Mussel L. Loletha Carrow , MD Age: 83 Referring MD:  Date of Birth: 1931-07-30 Gender: Female Account #: [id number] Procedure:                Upper GI endoscopy Indications:              Diarrhea, Weight loss Medicines:                Monitored Anesthesia Care Procedure:                Pre-Anesthesia Assessment:                           - Prior to the procedure, a History and Physical                            was performed, and patient medications and                            allergies were reviewed. The patient's tolerance of                            previous anesthesia was also reviewed. The risks                            and benefits of the procedure and the sedation                            options and risks were discussed with the patient.                            All questions were answered, and informed consent                            was obtained. Prior Anticoagulants: The patient has                            taken no previous anticoagulant or antiplatelet                            agents. ASA Grade Assessment: III - A patient with                            severe systemic disease. After reviewing the risks                            and benefits, the patient was deemed in                            satisfactory condition to undergo the procedure.                           After obtaining informed consent, the endoscope was  passed under direct vision. Throughout the                            procedure, the patient's blood pressure, pulse, and                            oxygen saturations were monitored continuously. The                            Endoscope was introduced through the mouth, and                            advanced to the second part of duodenum. The upper                            GI endoscopy was accomplished  without difficulty.                            The patient tolerated the procedure well. Scope In: Scope Out: Findings:                 The larynx was normal.                           The esophagus was normal.                           The stomach was normal.                           The cardia and gastric fundus were normal on                            retroflexion.                           The examined duodenum was normal. Biopsies for                            histology were taken with a cold forceps for                            evaluation of celiac disease and bacterial                            overgrowth. Six specimens taken from the 2nd                            portion and bulb. Complications:            No immediate complications. Estimated Blood Loss:     Estimated blood loss was minimal. Impression:               - Normal larynx.                           - Normal esophagus.                           -  Normal stomach.                           - Normal examined duodenum. Biopsied. Recommendation:           - Patient has a contact number available for                            emergencies. The signs and symptoms of potential                            delayed complications were discussed with the                            patient. Return to normal activities tomorrow.                            Written discharge instructions were provided to the                            patient.                           - Resume previous diet.                           - Continue present medications.                           - Await pathology results.                           - See the other procedure note for documentation of                            additional recommendations. Henry L. Loletha Carrow, MD 10/04/2016 4:03:08 PM This report has been signed electronically.

## 2016-10-04 NOTE — Op Note (Signed)
[facility] Patient Name: Christina Atkinson Procedure Date: 10/04/2016 3:32 PM MRN: [id number] Endoscopist: Mallie Mussel L. Loletha Carrow , MD Age: 83 Referring MD:  Date of Birth: 1931-06-27 Gender: Female Account #: [id number] Procedure:                Colonoscopy Indications:              Chronic diarrhea, Weight loss Medicines:                Monitored Anesthesia Care Procedure:                Pre-Anesthesia Assessment:                           - Prior to the procedure, a History and Physical                            was performed, and patient medications and                            allergies were reviewed. The patient's tolerance of                            previous anesthesia was also reviewed. The risks                            and benefits of the procedure and the sedation                            options and risks were discussed with the patient.                            All questions were answered, and informed consent                            was obtained. Prior Anticoagulants: The patient has                            taken no previous anticoagulant or antiplatelet                            agents. ASA Grade Assessment: III - A patient with                            severe systemic disease. After reviewing the risks                            and benefits, the patient was deemed in                            satisfactory condition to undergo the procedure.                           After obtaining informed consent, the colonoscope  was passed under direct vision. Throughout the                            procedure, the patient's blood pressure, pulse, and                            oxygen saturations were monitored continuously. The                            Colonoscope was introduced through the anus and                            advanced to the the terminal ileum. The colonoscopy                            was performed without  difficulty. The patient                            tolerated the procedure well. The quality of the                            bowel preparation was excellent. The terminal                            ileum, ileocecal valve, appendiceal orifice, and                            rectum were photographed. The quality of the bowel                            preparation was evaluated using the BBPS [facility]                            Bowel Preparation Scale) with scores of: Right                            Colon = 3, Transverse Colon = 3 and Left Colon = 3                            (entire mucosa seen well with no residual staining,                            small fragments of stool or opaque liquid). The                            total BBPS score equals 9. The bowel preparation                            used was SUPREP. Scope In: 3:45:40 PM Scope Out: 3:57:47 PM Scope Withdrawal Time: 0 hours 6 minutes 7 seconds  Total Procedure Duration: 0 hours 12 minutes 7 seconds  Findings:  The digital rectal exam findings include decreased                            sphincter tone.                           The terminal ileum appeared normal.                           An area of mildly congested mucosa was found in the                            entire colon. Biopsies for histology were taken                            with a cold forceps from the right colon and left                            colon for evaluation of microscopic colitis.                           The exam was otherwise without abnormality on                            direct and retroflexion views. Complications:            No immediate complications. Estimated Blood Loss:     Estimated blood loss: none. Impression:               - Decreased sphincter tone found on digital rectal                            exam.                           - The examined portion of the ileum was normal.                           -  Congested mucosa in the entire examined colon.                            Nonspecific finding, sometimes seen in microscopic                            colitis. Biopsied.                           - The examination was otherwise normal on direct                            and retroflexion views. Recommendation:           - Patient has a contact number available for                            emergencies. The signs and symptoms of potential  delayed complications were discussed with the                            patient. Return to normal activities tomorrow.                            Written discharge instructions were provided to the                            patient.                           - Resume previous diet.                           - Continue present medications.                           - Await pathology results. Henry L. Loletha Carrow, MD 10/04/2016 4:06:40 PM This report has been signed electronically.

## 2016-10-05 ENCOUNTER — Telehealth: Payer: Self-pay | Admitting: *Deleted

## 2016-10-05 NOTE — Telephone Encounter (Signed)
  Follow up Call-  Call back number 10/04/2016  Post procedure Call Back phone  # [phone]  Permission to leave phone message Yes     Patient questions:  Do you have a fever, pain , or abdominal swelling? No. Pain Score  0 *  Have you tolerated food without any problems? Yes.    Have you been able to return to your normal activities? Yes.    Do you have any questions about your discharge instructions: Diet   No. Medications  No. Follow up visit  No.  Do you have questions or concerns about your Care? No.  Actions: * If pain score is 4 or above: No action needed, pain <4.

## 2016-10-12 ENCOUNTER — Other Ambulatory Visit: Payer: Self-pay

## 2016-10-12 MED ORDER — BUDESONIDE 9 MG PO TB24: 1.0000 | ORAL_TABLET | Freq: Every day | ORAL | 1 refills | Status: AC

## 2016-10-27 ENCOUNTER — Other Ambulatory Visit: Payer: Self-pay | Admitting: Neurology

## 2016-10-29 MED ORDER — DONEPEZIL HCL 10 MG PO TABS: 10.0000 mg | ORAL_TABLET | Freq: Every day | ORAL | 1 refills | Status: DC

## 2016-10-29 NOTE — Telephone Encounter (Signed)
[facility] has not been prescribing pt's aricept. I spoke with Dr. Rexene Alberts, who is agreeable to continuing pt's aricept 10 mg daily.

## 2016-11-08 ENCOUNTER — Ambulatory Visit (INDEPENDENT_AMBULATORY_CARE_PROVIDER_SITE_OTHER): Payer: Medicare Other | Admitting: Neurology

## 2016-11-08 ENCOUNTER — Encounter: Payer: Self-pay | Admitting: Neurology

## 2016-11-08 ENCOUNTER — Ambulatory Visit: Payer: Self-pay | Admitting: Neurology

## 2016-11-08 DIAGNOSIS — G301 Alzheimer's disease with late onset: Secondary | ICD-10-CM

## 2016-11-08 DIAGNOSIS — F0280 Dementia in other diseases classified elsewhere without behavioral disturbance: Secondary | ICD-10-CM

## 2016-11-08 MED ORDER — MEMANTINE HCL ER 14 MG PO CP24: 14.0000 mg | ORAL_CAPSULE | Freq: Every day | ORAL | 1 refills | Status: AC

## 2016-11-08 NOTE — Progress Notes (Signed)
Subjective:    Patient ID: Christina Atkinson is a 83 y.o. female.  HPI     Interim history:   Christina Atkinson is an 83 year old right-handed woman with an underlying medical history of hypertension, chronic kidney disease, and overweight state, who presents for follow up consultation of her dementia. The patient is accompanied by her daughter-in-law again today. I first met her on 06/14/16, at the request of her primary care provider, at which time her MMSE was 13/30. I suggested she continue with Aricept generic and I started her on Namenda XR 7 mg. I ordered a brain MRI.  She had a brain MRI wo contrast on 09/03/2016 which I reviewed: IMPRESSION:  This MRI of the brain without contrast shows the following: 1.   Overall mild cortical atrophy that is more severe in the mesial temporal lobes this is nonspecific but can be seen with Alzheimer's disease. 2.   Mild age related chronic microvascular ischemic change. 3.   There are no acute findings.  We called her daughter-in-law with the test results.  Today, 11/08/16: She reports doing okay. Per [name], diarrhea better, recent GI visit, had colonoscopy and some Bx. Was given a 2 week Rx of inflammation. Appetite okay, weight stable for nearly 2 months. Tolerating the Namenda XR 7 mg. She may have been on Exelon patch some years ago, before she started the Aricept and before she moved from [facility]. No recent falls, no recent acute illness, thankfully. No other medication changes lately. Goes to the exercise room at [facility]. Daughter-in-law reports that she is having more difficulty holding a conversation. No mood related complaints, particularly, no depression, anxiety, or irritability or behavioral disturbance per daughter-in-law and per patient.  Previously (copied from previous notes for reference):   06/14/16: (She) was diagnosed with Alzheimer's dementia some 3 years ago with symptoms predating this by some time, unclear exact duration a she was residing in  [place] at the time and she had seen a doctor in [place]. She has been on Aricept generic for at least 3 years. She has had progressive memory decline. She has no overt behavioral disturbances, no tendency towards aggression or violence or anger, but she has been more anxious in the past year and a half and also perhaps more depressed. She has not been on an antidepressant oriented times ID medication. She moved from [place] a year and a half ago. She lives with her youngest son and his family which includes daughter-in-law and 4 children. She has another daughter in town who takes her to dinner once a week and also keeps the patient 1 week per month and she has another son in town who has started being more involved in her care and her oldest son lives in [place]. Her daughter-in-law provides most of the history. She's not aware of any family history of dementia with the exception of one younger sister of the patient has been diagnosed with Alzheimer's disease. She is younger by about 8 years. The patient is the oldest of 25 total siblings, she has 2 sisters and one brother. She quit smoking 30-40 years ago. She drinks quite a bit of coffee, 4-5 cups per day estimated by her daughter-in-law. Patient denies drinking that much coffee. She does not drink very much water. She has had diarrhea at least for the past 6 months and also significant weight loss. She's not a good eater. She has lost over 12 pounds in the last 3 months. Prior to that she lost weight  when she was living in [place] with her husband, who passed away in 27-Oct-2015.  I reviewed your office note from 05/16/2016. She has never had a brain MRI or head CT from what I understand. She no longer drives, has not driven in over 2 years. She goes to an exercise class about 3 days a week through [facility].    The patient's allergies, current medications, family history, past medical history, past social history, past surgical history and problem  list were reviewed and updated as appropriate.    Her Past Medical History Is Significant For: Past Medical History:  Diagnosis Date  . Alzheimer's dementia with behavioral disturbance    per Dr. Marco Collie 08/2015  . Anxiety   . Chronic diarrhea   . CKD (chronic kidney disease), stage II    per medical record per Dr. Marco Collie   . Colon polyps   . Dementia   . HTN (hypertension)     Her Past Surgical History Is Significant For: Past Surgical History:  Procedure Laterality Date  . CESAREAN SECTION    . TONSILLECTOMY      Her Family History Is Significant For: Family History  Problem Relation Age of Onset  . Heart attack Father   . Alzheimer's disease Sister     Her Social History Is Significant For: Social History   Social History  . Marital status: Widowed    Spouse name: N/A  . Number of children: 4  . Years of education: N/A   Occupational History  . retired    Social History Main Topics  . Smoking status: Former Smoker    Quit date: 09/14/1986  . Smokeless tobacco: Never Used  . Alcohol use No  . Drug use: No  . Sexual activity: Not Asked   Other Topics Concern  . None   Social History Narrative  . None    Her Allergies Are:  Allergies  Allergen Reactions  . Codeine Nausea And Vomiting  :   Her Current Medications Are:  Outpatient Encounter Prescriptions as of 11/08/2016  Medication Sig  . atenolol-chlorthalidone (TENORETIC) 100-25 MG tablet Take 1 tablet by mouth daily.  . Budesonide 9 MG TB24 Take 1 tablet by mouth daily.  Marland Kitchen donepezil (ARICEPT) 10 MG tablet Take 1 tablet (10 mg total) by mouth daily.  . memantine (NAMENDA XR) 7 MG CP24 24 hr capsule Take 1 capsule (7 mg total) by mouth daily.  . Misc Natural Products (GLUCOS-CHONDROIT-MSM COMPLEX PO) Take 1 tablet by mouth 2 (two) times daily.  . Multiple Vitamins-Minerals (CENTRUM SILVER PO) Take by mouth.  . naproxen sodium (ANAPROX) 220 MG tablet Take by mouth.  . Tetrahydrozoline  HCl (EYE DROPS OP) Apply to eye. From eye doctor  . [DISCONTINUED] ALPRAZolam (XANAX) 0.25 MG tablet Take by mouth.   Facility-Administered Encounter Medications as of 11/08/2016  Medication  . 0.9 %  sodium chloride infusion  :  Review of Systems:  Out of a complete 14 point review of systems, all are reviewed and negative with the exception of these symptoms as listed below: Review of Systems  Neurological:       Pt presents today to discuss her MRI and memory. Pt feels that her memory has been stable.    Objective:  Neurological Exam  Physical Exam Physical Examination:   Vitals:   11/08/16 1028  BP: (!) 146/77  Pulse: 73   General Examination: The patient is a very pleasant 83 y.o. female in no acute distress.  She appears well-developed and well-nourished and well groomed.   HEENT: Normocephalic, atraumatic, pupils are equal, round and reactive to light and accommodation. Extraocular tracking is mildly difficult for her. No nystagmus is noted. Hearing is grossly intact. Face is symmetric with normal facial animation and normal facial sensation. Speech is clear with no dysarthria noted. There is no hypophonia. There is no lip, neck/head, jaw or voice tremor. Neck is supple with full range of passive and active motion. There are no carotid bruits on auscultation. Oropharynx exam reveals: mild mouth dryness, tongue protrudes centrally and palate elevates symmetrically.   Chest: Clear to auscultation without wheezing, rhonchi or crackles noted.  Heart: S1+S2+0, regular and normal without murmurs, rubs or gallops noted.   Abdomen: Soft, non-tender and non-distended with normal bowel sounds appreciated on auscultation.  Extremities: There is no pitting edema in the distal lower extremities bilaterally. Pedal pulses are intact.  Skin: Warm and dry without trophic changes noted.  Musculoskeletal: exam reveals some soreness R knee.  Neurologically:  Mental status: The  patient is awake, but not fully oriented, only to self and situation. Her immediate and remote memory, attention, language skills and fund of knowledge are abnormal.   On 06/14/2016: MMSE: 13/30 (she lost 10 points on orientation, 3 points on spelling world backwards, 3 points on remote recand one point on repetition), CDT: 1/4, AFT: 4/min.  Speech is clear with normal prosody and enunciation. Thought process is linear. Mood is normal and affect is constricted.  Cranial nerves II - XII are as described above under HEENT exam. In addition: shoulder shrug is normal with equal shoulder height noted. Motor exam: Normal bulk, strength and tone is noted. There is no resting tremor. Romberg is not testable safely. Reflexes are 1+ throughout, absent in the ankles. Fine motor skills are globally mildly impaired.  Cerebellar testing: No dysmetria or intention tremor.  Sensory exam: intact to light touch in the upper and lower extremities bilaterally. Gait, station and balance: She stands with difficulty and has to push herself up, she does not require help. No obvious limp, no walking aid. She has a mild to moderately stooped posture. Tandem walk is not possible safely.   Assessment and Plan:   In summary, Myrle Wanek is a very pleasant 83 year old female with an underlying medical history of hypertension and arthritis, who has at least 3 year history of memory loss, history and physical exam are in keeping with Alzheimer's dementia without behavioral disturbance. She previously had some anxiety and depressive symptoms but nothing sustained. She had some weight loss and appetite loss and weight has consistently been going down since October 2017 when records are available in [place]. Nevertheless, in the past couple of months, her weight has been stable, she had a recent colonoscopy and biopsies and was treated for inflammation with a prescription for 2 weeks per GI. She has been able to tolerate Namenda  long-acting at the 7 mg strength and I would like to increase this today to 14 mg daily. She will also continue generic Aricept. We talked about maintaining a healthy lifestyle. We talked about her brain MRI results from May 2018. I suggested a 3 month follow-up with one of our nurse practitioners, at which time we will recheck memory scores and also try to increase her long-acting Namenda to 21 mg daily if possible. I answered all their questions today and the patient and her daughter-in-law were in agreement.  I spent 20 minutes in total face-to-face time with  the patient, more than 50% of which was spent in counseling and coordination of care, reviewing test results, reviewing medication and discussing or reviewing the diagnosis of dementia, its prognosis and treatment options. Pertinent laboratory and imaging test results that were available during this visit with the patient were reviewed by me and considered in my medical decision making (see chart for details).

## 2016-11-08 NOTE — Patient Instructions (Signed)
We will increase your Namenda XR to 14 mg daily at this time. Let's recheck in 3 months, you can see one of our NPs and we will try to increase the Namenda to 21 mg daily. We will keep going with the Donepezil 10 mg.

## 2016-12-20 DIAGNOSIS — H401132 Primary open-angle glaucoma, bilateral, moderate stage: Secondary | ICD-10-CM | POA: Diagnosis not present

## 2016-12-26 ENCOUNTER — Telehealth: Payer: Self-pay | Admitting: Family Medicine

## 2016-12-26 NOTE — Telephone Encounter (Signed)
pts daughter dropped off form to be filled out for the adult center pt daughter can be reached at [phone] when ready to be picked up, put in your folder

## 2016-12-27 ENCOUNTER — Other Ambulatory Visit: Payer: Self-pay | Admitting: Neurology

## 2016-12-27 DIAGNOSIS — G301 Alzheimer's disease with late onset: Principal | ICD-10-CM

## 2016-12-27 DIAGNOSIS — F0280 Dementia in other diseases classified elsewhere without behavioral disturbance: Secondary | ICD-10-CM

## 2017-01-16 ENCOUNTER — Other Ambulatory Visit: Payer: Self-pay | Admitting: Family Medicine

## 2017-01-16 DIAGNOSIS — I10 Essential (primary) hypertension: Secondary | ICD-10-CM

## 2017-01-23 DIAGNOSIS — H401130 Primary open-angle glaucoma, bilateral, stage unspecified: Secondary | ICD-10-CM | POA: Diagnosis not present

## 2017-01-30 ENCOUNTER — Telehealth: Payer: Self-pay | Admitting: Family Medicine

## 2017-01-30 NOTE — Telephone Encounter (Signed)
Cleon Dew from [facility] T# [phone] called for pt's last office visit notes and labs, these were faxed to T# [phone]

## 2017-02-11 NOTE — Progress Notes (Deleted)
[facility]  PATIENT: Christina Atkinson DOB: October 25, 1931   REASON FOR VISIT: follow-up for Alzheimers disease HISTORY FROM:    HISTORY OF PRESENT ILLNESS:Christina Atkinson is an 83 year old right-handed woman with an underlying medical history of hypertension, chronic kidney disease, and overweight state, who presents for follow up consultation of her dementia. The patient is accompanied by her daughter-in-law again today. I first met her on 06/14/16, at the request of her primary care provider, at which time her MMSE was 13/30. I suggested she continue with Aricept generic and I started her on Namenda XR 7 mg. I ordered a brain MRI.  She had a brain MRI wo contrast on 09/03/2016 which I reviewed: IMPRESSION: This MRI of the brain without contrast shows the following: 1. Overall mild cortical atrophy that is more severe in the mesial temporal lobes this is nonspecific but can be seen with Alzheimer's disease. 2. Mild age related chronic microvascular ischemic change. 3. There are no acute findings.  We called her daughter-in-law with the test results.  Today, 11/08/16: She reports doing okay. Per Christina Atkinson, diarrhea better, recent GI visit, had colonoscopy and some Bx. Was given a 2 week Rx of inflammation. Appetite okay, weight stable for nearly 2 months. Tolerating the Namenda XR 7 mg. She may have been on Exelon patch some years ago, before she started the Aricept and before she moved from [facility]. No recent falls, no recent acute illness, thankfully. No other medication changes lately. Goes to the exercise room at [facility]. Daughter-in-law reports that she is having more difficulty holding a conversation. No mood related complaints, particularly, no depression, anxiety, or irritability or behavioral disturbance per daughter-in-law and per patient.    REVIEW OF SYSTEMS: Full 14 system review of systems performed and notable only for those listed, all others are neg:    Constitutional: neg  Cardiovascular: neg Ear/Nose/Throat: neg  Skin: neg Eyes: neg Respiratory: neg Gastroitestinal: neg  Hematology/Lymphatic: neg  Endocrine: neg Musculoskeletal:neg Allergy/Immunology: neg Neurological: neg Psychiatric: neg Sleep : neg   ALLERGIES: Allergies  Allergen Reactions  . Codeine Nausea And Vomiting    HOME MEDICATIONS: Outpatient Medications Prior to Visit  Medication Sig Dispense Refill  . atenolol-chlorthalidone (TENORETIC) 100-25 MG tablet TAKE 1 TABLET BY MOUTH EVERY DAY 90 tablet 0  . Budesonide 9 MG TB24 Take 1 tablet by mouth daily. 30 tablet 1  . donepezil (ARICEPT) 10 MG tablet Take 1 tablet (10 mg total) by mouth daily. 90 tablet 1  . memantine (NAMENDA XR) 14 MG CP24 24 hr capsule Take 1 capsule (14 mg total) by mouth daily. 90 capsule 1  . Misc Natural Products (GLUCOS-CHONDROIT-MSM COMPLEX PO) Take 1 tablet by mouth 2 (two) times daily.    . Multiple Vitamins-Minerals (CENTRUM SILVER PO) Take by mouth.    . naproxen sodium (ANAPROX) 220 MG tablet Take by mouth.    . Tetrahydrozoline HCl (EYE DROPS OP) Apply to eye. From eye doctor     Facility-Administered Medications Prior to Visit  Medication Dose Route Frequency Provider Last Rate Last Dose  . 0.9 %  sodium chloride infusion  500 mL Intravenous Continuous Doran Stabler, MD        PAST MEDICAL HISTORY: Past Medical History:  Diagnosis Date  . Alzheimer's dementia with behavioral disturbance    per Dr. Marco Atkinson 08/2015  . Anxiety   . Chronic diarrhea   . CKD (chronic kidney disease), stage II    per medical record per Dr. Eustaquio Maize  Nyra Atkinson   . Colon polyps   . Dementia   . HTN (hypertension)     PAST SURGICAL HISTORY: Past Surgical History:  Procedure Laterality Date  . CESAREAN SECTION    . TONSILLECTOMY      FAMILY HISTORY: Family History  Problem Relation Age of Onset  . Heart attack Father   . Alzheimer's disease Sister     SOCIAL HISTORY: Social  History   Social History  . Marital status: Widowed    Spouse name: N/A  . Number of children: 4  . Years of education: N/A   Occupational History  . retired    Social History Main Topics  . Smoking status: Former Smoker    Quit date: 09/14/1986  . Smokeless tobacco: Never Used  . Alcohol use No  . Drug use: No  . Sexual activity: Not on file   Other Topics Concern  . Not on file   Social History Narrative  . No narrative on file     PHYSICAL EXAM  There were no vitals filed for this visit. There is no height or weight on file to calculate BMI.  Generalized: Well developed, in no acute distress  Head: normocephalic and atraumatic,. Oropharynx benign  Neck: Supple, no carotid bruits  Cardiac: Regular rate rhythm, no murmur  Musculoskeletal: No deformity   Neurological examination   Mentation: Alert oriented to time, place, history taking. Attention span and concentration appropriate. Recent and remote memory intact.  Follows all commands speech and language fluent.   Cranial nerve II-XII: Fundoscopic exam reveals sharp disc margins.Pupils were equal round reactive to light extraocular movements were full, visual field were full on confrontational test. Facial sensation and strength were normal. hearing was intact to finger rubbing bilaterally. Uvula tongue midline. head turning and shoulder shrug were normal and symmetric.Tongue protrusion into cheek strength was normal. Motor: normal bulk and tone, full strength in the BUE, BLE, fine finger movements normal, no pronator drift. No focal weakness Sensory: normal and symmetric to light touch, pinprick, and  Vibration, proprioception  Coordination: finger-nose-finger, heel-to-shin bilaterally, no dysmetria Reflexes: Brachioradialis 2/2, biceps 2/2, triceps 2/2, patellar 2/2, Achilles 2/2, plantar responses were flexor bilaterally. Gait and Station: Rising up from seated position without assistance, normal stance,  moderate  stride, good arm swing, smooth turning, able to perform tiptoe, and heel walking without difficulty. Tandem gait is steady  DIAGNOSTIC DATA (LABS, IMAGING, TESTING) - I reviewed patient records, labs, notes, testing and imaging myself where available.  Lab Results  Component Value Date   WBC 4.0 05/16/2016   HGB 12.3 05/16/2016   HCT 38.2 05/16/2016   MCV 91.8 05/16/2016   PLT 218 05/16/2016      Component Value Date/Time   NA 143 05/16/2016 0955   K 4.0 05/16/2016 0955   CL 106 05/16/2016 0955   CO2 30 05/16/2016 0955   GLUCOSE 101 (H) 05/16/2016 0955   BUN 19 05/16/2016 0955   CREATININE 0.82 05/16/2016 0955   CALCIUM 9.3 05/16/2016 0955   PROT 6.4 05/16/2016 0955   ALBUMIN 3.3 (L) 05/16/2016 0955   AST 19 05/16/2016 0955   ALT 12 05/16/2016 0955   ALKPHOS 66 05/16/2016 0955   BILITOT 0.5 05/16/2016 0955   No results found for: CHOL, HDL, LDLCALC, LDLDIRECT, TRIG, CHOLHDL No results found for: HGBA1C Lab Results  Component Value Date   VITAMINB12 718 05/16/2016   Lab Results  Component Value Date   TSH 1.79 05/16/2016  ASSESSMENT AND PLAN   Landree Fernholz a very pleasant 83 year old femalewith an underlying medical history of hypertension and arthritis, who has at least 3 year history of memory loss, history and physical exam are in keeping with Alzheimer's dementia without behavioral disturbance. She previously had some anxiety and depressive symptoms but nothing sustained. She had some weight loss and appetite loss and weight has consistently been going down since October 2017 when records are available in [place]. Nevertheless, in the past couple of months, her weight has been stable, she had a recent colonoscopy and biopsies and was treated for inflammation with a prescription for 2 weeks per GI. She has been able to tolerate Namenda long-acting at the 7 mg strength and I would like to increase this today to 14 mg daily. She will also continue generic Aricept.  We talked about maintaining a healthy lifestyle. We talked about her brain MRI results from May 2018. I suggested a 3 month follow-up with one of our nurse practitioners, at which time we will recheck memory scores and also try to increase her long-acting Namenda to 21 mg daily if possible. I  Dennie Bible, [facility], [facility], APRN  [facility] [address], [place], [place] [address] [phone]

## 2017-02-12 ENCOUNTER — Telehealth: Payer: Self-pay

## 2017-02-12 ENCOUNTER — Ambulatory Visit: Payer: Medicare Other | Admitting: Nurse Practitioner

## 2017-02-12 NOTE — Telephone Encounter (Signed)
I called, left a message per DPR, on pt's daughter in [place] phone number, advising her that pt's appt will need to be rescheduled this morning since Hoyle Sauer, NP is out of the office. I asked her to call back to reschedule.

## 2017-02-12 NOTE — Telephone Encounter (Signed)
I called pt to reschedule her appt with Hoyle Sauer, NP this morning. No answer, left a message asking her to call me back.

## 2017-02-12 NOTE — Telephone Encounter (Signed)
Spoke to [employer], daughter inlaw to reschedule.  She will call us back , not at home in front her calendar.

## 2017-04-09 ENCOUNTER — Other Ambulatory Visit: Payer: Self-pay | Admitting: Neurology

## 2017-09-25 DIAGNOSIS — I10 Essential (primary) hypertension: Secondary | ICD-10-CM | POA: Diagnosis not present

## 2017-09-30 DIAGNOSIS — F0390 Unspecified dementia without behavioral disturbance: Secondary | ICD-10-CM | POA: Diagnosis not present

## 2017-09-30 DIAGNOSIS — D649 Anemia, unspecified: Secondary | ICD-10-CM | POA: Diagnosis not present

## 2017-09-30 DIAGNOSIS — E039 Hypothyroidism, unspecified: Secondary | ICD-10-CM | POA: Diagnosis not present

## 2017-10-01 DIAGNOSIS — G309 Alzheimer's disease, unspecified: Secondary | ICD-10-CM | POA: Diagnosis not present

## 2017-10-01 DIAGNOSIS — R260 Ataxic gait: Secondary | ICD-10-CM | POA: Diagnosis not present

## 2017-10-01 DIAGNOSIS — F0280 Dementia in other diseases classified elsewhere without behavioral disturbance: Secondary | ICD-10-CM | POA: Diagnosis not present

## 2017-10-01 DIAGNOSIS — I739 Peripheral vascular disease, unspecified: Secondary | ICD-10-CM | POA: Diagnosis not present

## 2017-10-01 DIAGNOSIS — M8008XD Age-related osteoporosis with current pathological fracture, vertebra(e), subsequent encounter for fracture with routine healing: Secondary | ICD-10-CM | POA: Diagnosis not present

## 2017-10-01 DIAGNOSIS — I129 Hypertensive chronic kidney disease with stage 1 through stage 4 chronic kidney disease, or unspecified chronic kidney disease: Secondary | ICD-10-CM | POA: Diagnosis not present

## 2017-10-02 DIAGNOSIS — R260 Ataxic gait: Secondary | ICD-10-CM | POA: Diagnosis not present

## 2017-10-02 DIAGNOSIS — F0280 Dementia in other diseases classified elsewhere without behavioral disturbance: Secondary | ICD-10-CM | POA: Diagnosis not present

## 2017-10-02 DIAGNOSIS — I129 Hypertensive chronic kidney disease with stage 1 through stage 4 chronic kidney disease, or unspecified chronic kidney disease: Secondary | ICD-10-CM | POA: Diagnosis not present

## 2017-10-02 DIAGNOSIS — G309 Alzheimer's disease, unspecified: Secondary | ICD-10-CM | POA: Diagnosis not present

## 2017-10-02 DIAGNOSIS — I739 Peripheral vascular disease, unspecified: Secondary | ICD-10-CM | POA: Diagnosis not present

## 2017-10-02 DIAGNOSIS — M8008XD Age-related osteoporosis with current pathological fracture, vertebra(e), subsequent encounter for fracture with routine healing: Secondary | ICD-10-CM | POA: Diagnosis not present

## 2017-10-03 DIAGNOSIS — M8008XD Age-related osteoporosis with current pathological fracture, vertebra(e), subsequent encounter for fracture with routine healing: Secondary | ICD-10-CM | POA: Diagnosis not present

## 2017-10-03 DIAGNOSIS — F0280 Dementia in other diseases classified elsewhere without behavioral disturbance: Secondary | ICD-10-CM | POA: Diagnosis not present

## 2017-10-03 DIAGNOSIS — I129 Hypertensive chronic kidney disease with stage 1 through stage 4 chronic kidney disease, or unspecified chronic kidney disease: Secondary | ICD-10-CM | POA: Diagnosis not present

## 2017-10-03 DIAGNOSIS — I739 Peripheral vascular disease, unspecified: Secondary | ICD-10-CM | POA: Diagnosis not present

## 2017-10-03 DIAGNOSIS — G309 Alzheimer's disease, unspecified: Secondary | ICD-10-CM | POA: Diagnosis not present

## 2017-10-03 DIAGNOSIS — R260 Ataxic gait: Secondary | ICD-10-CM | POA: Diagnosis not present

## 2017-10-07 DIAGNOSIS — G309 Alzheimer's disease, unspecified: Secondary | ICD-10-CM | POA: Diagnosis not present

## 2017-10-07 DIAGNOSIS — R260 Ataxic gait: Secondary | ICD-10-CM | POA: Diagnosis not present

## 2017-10-07 DIAGNOSIS — M8008XD Age-related osteoporosis with current pathological fracture, vertebra(e), subsequent encounter for fracture with routine healing: Secondary | ICD-10-CM | POA: Diagnosis not present

## 2017-10-07 DIAGNOSIS — F0280 Dementia in other diseases classified elsewhere without behavioral disturbance: Secondary | ICD-10-CM | POA: Diagnosis not present

## 2017-10-07 DIAGNOSIS — I129 Hypertensive chronic kidney disease with stage 1 through stage 4 chronic kidney disease, or unspecified chronic kidney disease: Secondary | ICD-10-CM | POA: Diagnosis not present

## 2017-10-07 DIAGNOSIS — I739 Peripheral vascular disease, unspecified: Secondary | ICD-10-CM | POA: Diagnosis not present

## 2017-10-09 DIAGNOSIS — I739 Peripheral vascular disease, unspecified: Secondary | ICD-10-CM | POA: Diagnosis not present

## 2017-10-09 DIAGNOSIS — M8008XD Age-related osteoporosis with current pathological fracture, vertebra(e), subsequent encounter for fracture with routine healing: Secondary | ICD-10-CM | POA: Diagnosis not present

## 2017-10-09 DIAGNOSIS — G309 Alzheimer's disease, unspecified: Secondary | ICD-10-CM | POA: Diagnosis not present

## 2017-10-09 DIAGNOSIS — I129 Hypertensive chronic kidney disease with stage 1 through stage 4 chronic kidney disease, or unspecified chronic kidney disease: Secondary | ICD-10-CM | POA: Diagnosis not present

## 2017-10-09 DIAGNOSIS — F0280 Dementia in other diseases classified elsewhere without behavioral disturbance: Secondary | ICD-10-CM | POA: Diagnosis not present

## 2017-10-09 DIAGNOSIS — R260 Ataxic gait: Secondary | ICD-10-CM | POA: Diagnosis not present

## 2017-10-11 DIAGNOSIS — G309 Alzheimer's disease, unspecified: Secondary | ICD-10-CM | POA: Diagnosis not present

## 2017-10-11 DIAGNOSIS — F0280 Dementia in other diseases classified elsewhere without behavioral disturbance: Secondary | ICD-10-CM | POA: Diagnosis not present

## 2017-10-11 DIAGNOSIS — M8008XD Age-related osteoporosis with current pathological fracture, vertebra(e), subsequent encounter for fracture with routine healing: Secondary | ICD-10-CM | POA: Diagnosis not present

## 2017-10-11 DIAGNOSIS — I739 Peripheral vascular disease, unspecified: Secondary | ICD-10-CM | POA: Diagnosis not present

## 2017-10-11 DIAGNOSIS — R260 Ataxic gait: Secondary | ICD-10-CM | POA: Diagnosis not present

## 2017-10-11 DIAGNOSIS — I129 Hypertensive chronic kidney disease with stage 1 through stage 4 chronic kidney disease, or unspecified chronic kidney disease: Secondary | ICD-10-CM | POA: Diagnosis not present

## 2017-10-15 DIAGNOSIS — G309 Alzheimer's disease, unspecified: Secondary | ICD-10-CM | POA: Diagnosis not present

## 2017-10-15 DIAGNOSIS — I129 Hypertensive chronic kidney disease with stage 1 through stage 4 chronic kidney disease, or unspecified chronic kidney disease: Secondary | ICD-10-CM | POA: Diagnosis not present

## 2017-10-15 DIAGNOSIS — M8008XD Age-related osteoporosis with current pathological fracture, vertebra(e), subsequent encounter for fracture with routine healing: Secondary | ICD-10-CM | POA: Diagnosis not present

## 2017-10-15 DIAGNOSIS — I739 Peripheral vascular disease, unspecified: Secondary | ICD-10-CM | POA: Diagnosis not present

## 2017-10-15 DIAGNOSIS — R260 Ataxic gait: Secondary | ICD-10-CM | POA: Diagnosis not present

## 2017-10-15 DIAGNOSIS — F0280 Dementia in other diseases classified elsewhere without behavioral disturbance: Secondary | ICD-10-CM | POA: Diagnosis not present

## 2017-10-16 DIAGNOSIS — M8008XD Age-related osteoporosis with current pathological fracture, vertebra(e), subsequent encounter for fracture with routine healing: Secondary | ICD-10-CM | POA: Diagnosis not present

## 2017-10-16 DIAGNOSIS — I129 Hypertensive chronic kidney disease with stage 1 through stage 4 chronic kidney disease, or unspecified chronic kidney disease: Secondary | ICD-10-CM | POA: Diagnosis not present

## 2017-10-16 DIAGNOSIS — R260 Ataxic gait: Secondary | ICD-10-CM | POA: Diagnosis not present

## 2017-10-16 DIAGNOSIS — I739 Peripheral vascular disease, unspecified: Secondary | ICD-10-CM | POA: Diagnosis not present

## 2017-10-16 DIAGNOSIS — G309 Alzheimer's disease, unspecified: Secondary | ICD-10-CM | POA: Diagnosis not present

## 2017-10-16 DIAGNOSIS — F0280 Dementia in other diseases classified elsewhere without behavioral disturbance: Secondary | ICD-10-CM | POA: Diagnosis not present

## 2017-10-17 DIAGNOSIS — I739 Peripheral vascular disease, unspecified: Secondary | ICD-10-CM | POA: Diagnosis not present

## 2017-10-17 DIAGNOSIS — R260 Ataxic gait: Secondary | ICD-10-CM | POA: Diagnosis not present

## 2017-10-17 DIAGNOSIS — M8008XD Age-related osteoporosis with current pathological fracture, vertebra(e), subsequent encounter for fracture with routine healing: Secondary | ICD-10-CM | POA: Diagnosis not present

## 2017-10-17 DIAGNOSIS — I129 Hypertensive chronic kidney disease with stage 1 through stage 4 chronic kidney disease, or unspecified chronic kidney disease: Secondary | ICD-10-CM | POA: Diagnosis not present

## 2017-10-17 DIAGNOSIS — G309 Alzheimer's disease, unspecified: Secondary | ICD-10-CM | POA: Diagnosis not present

## 2017-10-17 DIAGNOSIS — F0280 Dementia in other diseases classified elsewhere without behavioral disturbance: Secondary | ICD-10-CM | POA: Diagnosis not present

## 2017-10-18 DIAGNOSIS — R260 Ataxic gait: Secondary | ICD-10-CM | POA: Diagnosis not present

## 2017-10-18 DIAGNOSIS — I739 Peripheral vascular disease, unspecified: Secondary | ICD-10-CM | POA: Diagnosis not present

## 2017-10-18 DIAGNOSIS — G309 Alzheimer's disease, unspecified: Secondary | ICD-10-CM | POA: Diagnosis not present

## 2017-10-18 DIAGNOSIS — M8008XD Age-related osteoporosis with current pathological fracture, vertebra(e), subsequent encounter for fracture with routine healing: Secondary | ICD-10-CM | POA: Diagnosis not present

## 2017-10-18 DIAGNOSIS — F0280 Dementia in other diseases classified elsewhere without behavioral disturbance: Secondary | ICD-10-CM | POA: Diagnosis not present

## 2017-10-18 DIAGNOSIS — I129 Hypertensive chronic kidney disease with stage 1 through stage 4 chronic kidney disease, or unspecified chronic kidney disease: Secondary | ICD-10-CM | POA: Diagnosis not present

## 2017-10-21 DIAGNOSIS — M8008XD Age-related osteoporosis with current pathological fracture, vertebra(e), subsequent encounter for fracture with routine healing: Secondary | ICD-10-CM | POA: Diagnosis not present

## 2017-10-21 DIAGNOSIS — F0280 Dementia in other diseases classified elsewhere without behavioral disturbance: Secondary | ICD-10-CM | POA: Diagnosis not present

## 2017-10-21 DIAGNOSIS — G309 Alzheimer's disease, unspecified: Secondary | ICD-10-CM | POA: Diagnosis not present

## 2017-10-21 DIAGNOSIS — I739 Peripheral vascular disease, unspecified: Secondary | ICD-10-CM | POA: Diagnosis not present

## 2017-10-21 DIAGNOSIS — I129 Hypertensive chronic kidney disease with stage 1 through stage 4 chronic kidney disease, or unspecified chronic kidney disease: Secondary | ICD-10-CM | POA: Diagnosis not present

## 2017-10-21 DIAGNOSIS — R260 Ataxic gait: Secondary | ICD-10-CM | POA: Diagnosis not present

## 2017-10-22 DIAGNOSIS — I739 Peripheral vascular disease, unspecified: Secondary | ICD-10-CM | POA: Diagnosis not present

## 2017-10-22 DIAGNOSIS — I129 Hypertensive chronic kidney disease with stage 1 through stage 4 chronic kidney disease, or unspecified chronic kidney disease: Secondary | ICD-10-CM | POA: Diagnosis not present

## 2017-10-22 DIAGNOSIS — F0280 Dementia in other diseases classified elsewhere without behavioral disturbance: Secondary | ICD-10-CM | POA: Diagnosis not present

## 2017-10-22 DIAGNOSIS — R260 Ataxic gait: Secondary | ICD-10-CM | POA: Diagnosis not present

## 2017-10-22 DIAGNOSIS — G309 Alzheimer's disease, unspecified: Secondary | ICD-10-CM | POA: Diagnosis not present

## 2017-10-22 DIAGNOSIS — M8008XD Age-related osteoporosis with current pathological fracture, vertebra(e), subsequent encounter for fracture with routine healing: Secondary | ICD-10-CM | POA: Diagnosis not present

## 2017-10-23 DIAGNOSIS — F0280 Dementia in other diseases classified elsewhere without behavioral disturbance: Secondary | ICD-10-CM | POA: Diagnosis not present

## 2017-10-23 DIAGNOSIS — G309 Alzheimer's disease, unspecified: Secondary | ICD-10-CM | POA: Diagnosis not present

## 2017-10-23 DIAGNOSIS — R260 Ataxic gait: Secondary | ICD-10-CM | POA: Diagnosis not present

## 2017-10-23 DIAGNOSIS — I129 Hypertensive chronic kidney disease with stage 1 through stage 4 chronic kidney disease, or unspecified chronic kidney disease: Secondary | ICD-10-CM | POA: Diagnosis not present

## 2017-10-23 DIAGNOSIS — I739 Peripheral vascular disease, unspecified: Secondary | ICD-10-CM | POA: Diagnosis not present

## 2017-10-23 DIAGNOSIS — M8008XD Age-related osteoporosis with current pathological fracture, vertebra(e), subsequent encounter for fracture with routine healing: Secondary | ICD-10-CM | POA: Diagnosis not present

## 2017-10-30 DIAGNOSIS — M8008XD Age-related osteoporosis with current pathological fracture, vertebra(e), subsequent encounter for fracture with routine healing: Secondary | ICD-10-CM | POA: Diagnosis not present

## 2017-10-30 DIAGNOSIS — F0280 Dementia in other diseases classified elsewhere without behavioral disturbance: Secondary | ICD-10-CM | POA: Diagnosis not present

## 2017-10-30 DIAGNOSIS — G4700 Insomnia, unspecified: Secondary | ICD-10-CM | POA: Diagnosis not present

## 2017-10-30 DIAGNOSIS — I129 Hypertensive chronic kidney disease with stage 1 through stage 4 chronic kidney disease, or unspecified chronic kidney disease: Secondary | ICD-10-CM | POA: Diagnosis not present

## 2017-10-30 DIAGNOSIS — F419 Anxiety disorder, unspecified: Secondary | ICD-10-CM | POA: Diagnosis not present

## 2017-10-30 DIAGNOSIS — F0391 Unspecified dementia with behavioral disturbance: Secondary | ICD-10-CM | POA: Diagnosis not present

## 2017-10-30 DIAGNOSIS — I739 Peripheral vascular disease, unspecified: Secondary | ICD-10-CM | POA: Diagnosis not present

## 2017-10-30 DIAGNOSIS — G309 Alzheimer's disease, unspecified: Secondary | ICD-10-CM | POA: Diagnosis not present

## 2017-10-30 DIAGNOSIS — R260 Ataxic gait: Secondary | ICD-10-CM | POA: Diagnosis not present

## 2017-10-31 DIAGNOSIS — D519 Vitamin B12 deficiency anemia, unspecified: Secondary | ICD-10-CM | POA: Diagnosis not present

## 2017-10-31 DIAGNOSIS — D649 Anemia, unspecified: Secondary | ICD-10-CM | POA: Diagnosis not present

## 2017-11-04 DIAGNOSIS — L603 Nail dystrophy: Secondary | ICD-10-CM | POA: Diagnosis not present

## 2017-11-04 DIAGNOSIS — G300 Alzheimer's disease with early onset: Secondary | ICD-10-CM | POA: Diagnosis not present

## 2017-11-04 DIAGNOSIS — M79673 Pain in unspecified foot: Secondary | ICD-10-CM | POA: Diagnosis not present

## 2017-11-04 DIAGNOSIS — L851 Acquired keratosis [keratoderma] palmaris et plantaris: Secondary | ICD-10-CM | POA: Diagnosis not present

## 2017-11-04 DIAGNOSIS — R2681 Unsteadiness on feet: Secondary | ICD-10-CM | POA: Diagnosis not present

## 2017-11-06 DIAGNOSIS — M8008XD Age-related osteoporosis with current pathological fracture, vertebra(e), subsequent encounter for fracture with routine healing: Secondary | ICD-10-CM | POA: Diagnosis not present

## 2017-11-06 DIAGNOSIS — R260 Ataxic gait: Secondary | ICD-10-CM | POA: Diagnosis not present

## 2017-11-06 DIAGNOSIS — G309 Alzheimer's disease, unspecified: Secondary | ICD-10-CM | POA: Diagnosis not present

## 2017-11-06 DIAGNOSIS — F0280 Dementia in other diseases classified elsewhere without behavioral disturbance: Secondary | ICD-10-CM | POA: Diagnosis not present

## 2017-11-06 DIAGNOSIS — I129 Hypertensive chronic kidney disease with stage 1 through stage 4 chronic kidney disease, or unspecified chronic kidney disease: Secondary | ICD-10-CM | POA: Diagnosis not present

## 2017-11-06 DIAGNOSIS — I739 Peripheral vascular disease, unspecified: Secondary | ICD-10-CM | POA: Diagnosis not present

## 2017-11-08 DIAGNOSIS — I129 Hypertensive chronic kidney disease with stage 1 through stage 4 chronic kidney disease, or unspecified chronic kidney disease: Secondary | ICD-10-CM | POA: Diagnosis not present

## 2017-11-08 DIAGNOSIS — F0280 Dementia in other diseases classified elsewhere without behavioral disturbance: Secondary | ICD-10-CM | POA: Diagnosis not present

## 2017-11-08 DIAGNOSIS — I739 Peripheral vascular disease, unspecified: Secondary | ICD-10-CM | POA: Diagnosis not present

## 2017-11-08 DIAGNOSIS — R260 Ataxic gait: Secondary | ICD-10-CM | POA: Diagnosis not present

## 2017-11-08 DIAGNOSIS — G309 Alzheimer's disease, unspecified: Secondary | ICD-10-CM | POA: Diagnosis not present

## 2017-11-08 DIAGNOSIS — M8008XD Age-related osteoporosis with current pathological fracture, vertebra(e), subsequent encounter for fracture with routine healing: Secondary | ICD-10-CM | POA: Diagnosis not present

## 2017-11-13 DIAGNOSIS — I739 Peripheral vascular disease, unspecified: Secondary | ICD-10-CM | POA: Diagnosis not present

## 2017-11-13 DIAGNOSIS — M8008XD Age-related osteoporosis with current pathological fracture, vertebra(e), subsequent encounter for fracture with routine healing: Secondary | ICD-10-CM | POA: Diagnosis not present

## 2017-11-13 DIAGNOSIS — F0280 Dementia in other diseases classified elsewhere without behavioral disturbance: Secondary | ICD-10-CM | POA: Diagnosis not present

## 2017-11-13 DIAGNOSIS — I129 Hypertensive chronic kidney disease with stage 1 through stage 4 chronic kidney disease, or unspecified chronic kidney disease: Secondary | ICD-10-CM | POA: Diagnosis not present

## 2017-11-13 DIAGNOSIS — R260 Ataxic gait: Secondary | ICD-10-CM | POA: Diagnosis not present

## 2017-11-13 DIAGNOSIS — G309 Alzheimer's disease, unspecified: Secondary | ICD-10-CM | POA: Diagnosis not present

## 2017-11-27 DIAGNOSIS — F0391 Unspecified dementia with behavioral disturbance: Secondary | ICD-10-CM | POA: Diagnosis not present

## 2017-11-27 DIAGNOSIS — G4700 Insomnia, unspecified: Secondary | ICD-10-CM | POA: Diagnosis not present

## 2017-11-27 DIAGNOSIS — F419 Anxiety disorder, unspecified: Secondary | ICD-10-CM | POA: Diagnosis not present

## 2017-12-11 DIAGNOSIS — F0391 Unspecified dementia with behavioral disturbance: Secondary | ICD-10-CM | POA: Diagnosis not present

## 2017-12-11 DIAGNOSIS — G4700 Insomnia, unspecified: Secondary | ICD-10-CM | POA: Diagnosis not present

## 2017-12-11 DIAGNOSIS — F419 Anxiety disorder, unspecified: Secondary | ICD-10-CM | POA: Diagnosis not present

## 2017-12-20 DIAGNOSIS — G4700 Insomnia, unspecified: Secondary | ICD-10-CM | POA: Diagnosis not present

## 2017-12-20 DIAGNOSIS — G309 Alzheimer's disease, unspecified: Secondary | ICD-10-CM | POA: Diagnosis not present

## 2017-12-25 DIAGNOSIS — F0391 Unspecified dementia with behavioral disturbance: Secondary | ICD-10-CM | POA: Diagnosis not present

## 2017-12-25 DIAGNOSIS — F419 Anxiety disorder, unspecified: Secondary | ICD-10-CM | POA: Diagnosis not present

## 2017-12-25 DIAGNOSIS — G4700 Insomnia, unspecified: Secondary | ICD-10-CM | POA: Diagnosis not present

## 2017-12-26 DIAGNOSIS — Z79899 Other long term (current) drug therapy: Secondary | ICD-10-CM | POA: Diagnosis not present

## 2017-12-26 DIAGNOSIS — D649 Anemia, unspecified: Secondary | ICD-10-CM | POA: Diagnosis not present

## 2018-01-06 DIAGNOSIS — G300 Alzheimer's disease with early onset: Secondary | ICD-10-CM | POA: Diagnosis not present

## 2018-01-06 DIAGNOSIS — L851 Acquired keratosis [keratoderma] palmaris et plantaris: Secondary | ICD-10-CM | POA: Diagnosis not present

## 2018-01-06 DIAGNOSIS — M79673 Pain in unspecified foot: Secondary | ICD-10-CM | POA: Diagnosis not present

## 2018-01-06 DIAGNOSIS — R2689 Other abnormalities of gait and mobility: Secondary | ICD-10-CM | POA: Diagnosis not present

## 2018-01-06 DIAGNOSIS — L603 Nail dystrophy: Secondary | ICD-10-CM | POA: Diagnosis not present

## 2018-01-08 DIAGNOSIS — F419 Anxiety disorder, unspecified: Secondary | ICD-10-CM | POA: Diagnosis not present

## 2018-01-08 DIAGNOSIS — G4700 Insomnia, unspecified: Secondary | ICD-10-CM | POA: Diagnosis not present

## 2018-01-08 DIAGNOSIS — F0391 Unspecified dementia with behavioral disturbance: Secondary | ICD-10-CM | POA: Diagnosis not present

## 2018-02-20 ENCOUNTER — Emergency Department (HOSPITAL_COMMUNITY)
Admission: EM | Admit: 2018-02-20 | Discharge: 2018-02-20 | Disposition: A | Discharge status: Home / Self Care | Payer: Medicare Other | Attending: Emergency Medicine | Admitting: Emergency Medicine

## 2018-02-20 ENCOUNTER — Encounter (HOSPITAL_COMMUNITY): Payer: Self-pay

## 2018-02-20 DIAGNOSIS — R404 Transient alteration of awareness: Secondary | ICD-10-CM | POA: Diagnosis not present

## 2018-02-20 DIAGNOSIS — F0390 Unspecified dementia without behavioral disturbance: Secondary | ICD-10-CM | POA: Insufficient documentation

## 2018-02-20 DIAGNOSIS — Z79899 Other long term (current) drug therapy: Secondary | ICD-10-CM | POA: Insufficient documentation

## 2018-02-20 DIAGNOSIS — N182 Chronic kidney disease, stage 2 (mild): Secondary | ICD-10-CM | POA: Diagnosis not present

## 2018-02-20 DIAGNOSIS — I129 Hypertensive chronic kidney disease with stage 1 through stage 4 chronic kidney disease, or unspecified chronic kidney disease: Secondary | ICD-10-CM | POA: Diagnosis not present

## 2018-02-20 DIAGNOSIS — Z87891 Personal history of nicotine dependence: Secondary | ICD-10-CM | POA: Insufficient documentation

## 2018-02-20 DIAGNOSIS — I10 Essential (primary) hypertension: Secondary | ICD-10-CM | POA: Diagnosis not present

## 2018-02-20 LAB — CBC WITH DIFFERENTIAL/PLATELET
Abs Immature Granulocytes: 0.01 10*3/uL (ref 0.00–0.07)
Basophils Absolute: 0 10*3/uL (ref 0.0–0.1)
Basophils Relative: 1 %
EOS ABS: 0 10*3/uL (ref 0.0–0.5)
Eosinophils Relative: 1 %
HEMATOCRIT: 37.7 % (ref 36.0–46.0)
HEMOGLOBIN: 11.9 g/dL — AB (ref 12.0–15.0)
Immature Granulocytes: 0 %
LYMPHS ABS: 1 10*3/uL (ref 0.7–4.0)
LYMPHS PCT: 30 %
MCH: 29.5 pg (ref 26.0–34.0)
MCHC: 31.6 g/dL (ref 30.0–36.0)
MCV: 93.3 fL (ref 80.0–100.0)
MONO ABS: 0.4 10*3/uL (ref 0.1–1.0)
MONOS PCT: 11 %
Neutro Abs: 1.9 10*3/uL (ref 1.7–7.7)
Neutrophils Relative %: 57 %
Platelets: 136 10*3/uL — ABNORMAL LOW (ref 150–400)
RBC: 4.04 MIL/uL (ref 3.87–5.11)
RDW: 14 % (ref 11.5–15.5)
WBC: 3.2 10*3/uL — ABNORMAL LOW (ref 4.0–10.5)
nRBC: 0 % (ref 0.0–0.2)

## 2018-02-20 LAB — TROPONIN I: Troponin I: <0.03 ng/mL (ref <0.03)

## 2018-02-20 LAB — BASIC METABOLIC PANEL
Anion gap: 8 (ref 5–15)
BUN: 13 mg/dL (ref 8–23)
CALCIUM: 9.4 mg/dL (ref 8.9–10.3)
CO2: 27 mmol/L (ref 22–32)
CREATININE: 0.88 mg/dL (ref 0.44–1.00)
Chloride: 106 mmol/L (ref 98–111)
GFR calc Af Amer: >60 mL/min (ref >60)
GFR calc non Af Amer: 59 mL/min — ABNORMAL LOW (ref >60)
GLUCOSE: 96 mg/dL (ref 70–99)
Potassium: 3.8 mmol/L (ref 3.5–5.1)
Sodium: 141 mmol/L (ref 135–145)

## 2018-02-20 MED ORDER — SODIUM CHLORIDE 0.9 % IV SOLN
INTRAVENOUS | Status: DC
  Administered 2018-02-20: 20 mL/h via INTRAVENOUS

## 2018-02-20 MED ORDER — LISINOPRIL 10 MG PO TABS
5.0000 mg | ORAL_TABLET | Freq: Once | ORAL | Status: AC
  Administered 2018-02-20: 5 mg via ORAL
  Filled 2018-02-20: qty 1

## 2018-02-20 NOTE — ED Notes (Signed)
Per [facility] RN pt BP is typically 115-130's/80. States this morning she came up to RN twice this morning crying and rubbing chest and head, which is not typical for patient.

## 2018-02-20 NOTE — ED Triage Notes (Signed)
Pt presents from [facility] for HTN. No meds today. Pt is at baseline mental status, nonverbal.

## 2018-02-20 NOTE — ED Notes (Signed)
D/c reviewed with patient's children.

## 2018-02-20 NOTE — ED Provider Notes (Signed)
[facility] EMERGENCY DEPARTMENT Provider Note   CSN: [id number] Arrival date & time: 02/20/18  3875     History   Chief Complaint Chief Complaint  Patient presents with  . Hypertension    HPI Christina Atkinson is a 84 y.o. female.  84 year old female with history of dementia who was found this morning at her facility with increased blood pressure.  Blood pressure according to EMS was 197/105.  Her baseline blood pressures is unknown at this time but she does have a history of hypertension.  No recent illnesses noted.  Unknown if patient has had change to her medications by her physician.  Patient's daughter at bedside states that patient is at her baseline.  And she has not expressed any complaints other than possibly an episode of fleeting chest pain when the patient was questioned if she had chest pain but the daughter states that the patient says yes to everything.     Past Medical History:  Diagnosis Date  . Alzheimer's dementia with behavioral disturbance    per Dr. Marco Collie 08/2015  . Anxiety   . Chronic diarrhea   . CKD (chronic kidney disease), stage II    per medical record per Dr. Marco Collie   . Colon polyps   . Dementia   . HTN (hypertension)     Patient Active Problem List   Diagnosis Date Noted  . History of colon polyps 08/15/2016  . CKD (chronic kidney disease), stage II   . Alzheimer's dementia with behavioral disturbance ([place])   . HTN (hypertension) 01/23/2016  . Dementia ([place]) 01/23/2016  . Chronic diarrhea of unknown origin 01/23/2016  . Rhinorrhea 01/23/2016    Past Surgical History:  Procedure Laterality Date  . CESAREAN SECTION    . TONSILLECTOMY       OB History   None      Home Medications    Prior to Admission medications   Medication Sig Start Date End Date Taking? Authorizing Provider  atenolol-chlorthalidone (TENORETIC) 100-25 MG tablet TAKE 1 TABLET BY MOUTH EVERY DAY 01/16/17   Henson, Vickie L, NP-C    Budesonide 9 MG TB24 Take 1 tablet by mouth daily. 10/12/16   Nelida Meuse III, MD  donepezil (ARICEPT) 10 MG tablet TAKE 1 TABLET BY MOUTH ONCE DAILY 04/09/17   Star Age, MD  memantine (NAMENDA XR) 14 MG CP24 24 hr capsule Take 1 capsule (14 mg total) by mouth daily. 11/08/16   Star Age, MD  Misc Natural Products (GLUCOS-CHONDROIT-MSM COMPLEX PO) Take 1 tablet by mouth 2 (two) times daily.    [provider]  Multiple Vitamins-Minerals (CENTRUM SILVER PO) Take by mouth.    [provider]  naproxen sodium (ANAPROX) 220 MG tablet Take by mouth.    [provider]  Tetrahydrozoline HCl (EYE DROPS OP) Apply to eye. From eye doctor    [provider]    Family History Family History  Problem Relation Age of Onset  . Heart attack Father   . Alzheimer's disease Sister     Social History Social History   Tobacco Use  . Smoking status: Former Smoker    Last attempt to quit: 09/14/1986    Years since quitting: 31.4  . Smokeless tobacco: Never Used  Substance Use Topics  . Alcohol use: No  . Drug use: No     Allergies   Codeine   Review of Systems Review of Systems  Unable to perform ROS: Dementia  Physical Exam Updated Vital Signs There were no vitals taken for this visit.  Physical Exam  Constitutional: She appears well-developed and well-nourished.  Non-toxic appearance. No distress.  HENT:  Head: Normocephalic and atraumatic.  Eyes: Pupils are equal, round, and reactive to light. Conjunctivae, EOM and lids are normal.  Neck: Normal range of motion. Neck supple. No tracheal deviation present. No thyroid mass present.  Cardiovascular: Normal rate, regular rhythm and normal heart sounds. Exam reveals no gallop.  No murmur heard. Pulmonary/Chest: Effort normal and breath sounds normal. No stridor. No respiratory distress. She has no decreased breath sounds. She has no wheezes. She has no rhonchi. She has no rales.  Abdominal:  Soft. Normal appearance and bowel sounds are normal. She exhibits no distension. There is no tenderness. There is no rebound and no CVA tenderness.  Musculoskeletal: Normal range of motion. She exhibits no edema or tenderness.  Neurological: She is alert. She displays no tremor. No cranial nerve deficit or sensory deficit. GCS eye subscore is 4. GCS verbal subscore is 4. GCS motor subscore is 6.  At baseline per daughter  Skin: Skin is warm and dry. No abrasion and no rash noted.  Psychiatric: She is inattentive.  Nursing note and vitals reviewed.    ED Treatments / Results  Labs (all labs ordered are listed, but only abnormal results are displayed) Labs Reviewed - No data to display  EKG EKG Interpretation  Date/Time:  Thursday February 20 2018 08:52:27 EDT Ventricular Rate:  65 PR Interval:    QRS Duration: 123 QT Interval:  429 QTC Calculation: 447 R Axis:   43 Text Interpretation:  Sinus rhythm Ventricular premature complex Consider left atrial enlargement IVCD, consider atypical RBBB Confirmed by Lacretia Leigh ([id number]) on 02/20/2018 9:50:32 AM   Radiology No results found.  Procedures Procedures (including critical care time)  Medications Ordered in ED Medications  0.9 %  sodium chloride infusion (has no administration in time range)     Initial Impression / Assessment and Plan / ED Course  I have reviewed the triage vital signs and the nursing notes.  Pertinent labs & imaging results that were available during my care of the patient were reviewed by me and considered in my medical decision making (see chart for details).     Given her morning dose of medication here.  Pressure has improved. She is EKG without acute ischemic changes.  Troponin negative.  Renal function appropriate.  Patient has not expressed any complaints of chest discomfort here.  Will discharge home with daughter who feels comfortable taking her back to her facility Final Clinical  Impressions(s) / ED Diagnoses   Final diagnoses:  None    ED Discharge Orders    None       Lacretia Leigh, MD 02/20/18 1039

## 2018-03-05 DIAGNOSIS — G4700 Insomnia, unspecified: Secondary | ICD-10-CM | POA: Diagnosis not present

## 2018-03-05 DIAGNOSIS — F0391 Unspecified dementia with behavioral disturbance: Secondary | ICD-10-CM | POA: Diagnosis not present

## 2018-03-05 DIAGNOSIS — F419 Anxiety disorder, unspecified: Secondary | ICD-10-CM | POA: Diagnosis not present

## 2018-03-10 DIAGNOSIS — L603 Nail dystrophy: Secondary | ICD-10-CM | POA: Diagnosis not present

## 2018-03-10 DIAGNOSIS — R2681 Unsteadiness on feet: Secondary | ICD-10-CM | POA: Diagnosis not present

## 2018-03-10 DIAGNOSIS — L851 Acquired keratosis [keratoderma] palmaris et plantaris: Secondary | ICD-10-CM | POA: Diagnosis not present

## 2018-03-10 DIAGNOSIS — G300 Alzheimer's disease with early onset: Secondary | ICD-10-CM | POA: Diagnosis not present

## 2018-03-10 DIAGNOSIS — M79673 Pain in unspecified foot: Secondary | ICD-10-CM | POA: Diagnosis not present

## 2018-03-19 DIAGNOSIS — F0391 Unspecified dementia with behavioral disturbance: Secondary | ICD-10-CM | POA: Diagnosis not present

## 2018-03-19 DIAGNOSIS — G4700 Insomnia, unspecified: Secondary | ICD-10-CM | POA: Diagnosis not present

## 2018-03-19 DIAGNOSIS — F419 Anxiety disorder, unspecified: Secondary | ICD-10-CM | POA: Diagnosis not present

## 2018-04-30 DIAGNOSIS — F419 Anxiety disorder, unspecified: Secondary | ICD-10-CM | POA: Diagnosis not present

## 2018-04-30 DIAGNOSIS — G4700 Insomnia, unspecified: Secondary | ICD-10-CM | POA: Diagnosis not present

## 2018-04-30 DIAGNOSIS — F0391 Unspecified dementia with behavioral disturbance: Secondary | ICD-10-CM | POA: Diagnosis not present

## 2018-05-12 DIAGNOSIS — G300 Alzheimer's disease with early onset: Secondary | ICD-10-CM | POA: Diagnosis not present

## 2018-05-12 DIAGNOSIS — R2689 Other abnormalities of gait and mobility: Secondary | ICD-10-CM | POA: Diagnosis not present

## 2018-05-12 DIAGNOSIS — L851 Acquired keratosis [keratoderma] palmaris et plantaris: Secondary | ICD-10-CM | POA: Diagnosis not present

## 2018-05-12 DIAGNOSIS — M79673 Pain in unspecified foot: Secondary | ICD-10-CM | POA: Diagnosis not present

## 2018-05-14 DIAGNOSIS — F419 Anxiety disorder, unspecified: Secondary | ICD-10-CM | POA: Diagnosis not present

## 2018-05-14 DIAGNOSIS — F0391 Unspecified dementia with behavioral disturbance: Secondary | ICD-10-CM | POA: Diagnosis not present

## 2018-05-14 DIAGNOSIS — G4700 Insomnia, unspecified: Secondary | ICD-10-CM | POA: Diagnosis not present

## 2018-05-16 ENCOUNTER — Other Ambulatory Visit: Payer: Self-pay

## 2018-05-16 ENCOUNTER — Emergency Department (HOSPITAL_COMMUNITY)
Admission: EM | Admit: 2018-05-16 | Discharge: 2018-05-17 | Disposition: A | Discharge status: Home / Self Care | Payer: Medicare Other | Attending: Emergency Medicine | Admitting: Emergency Medicine

## 2018-05-16 ENCOUNTER — Encounter (HOSPITAL_COMMUNITY): Payer: Self-pay | Admitting: Emergency Medicine

## 2018-05-16 DIAGNOSIS — I129 Hypertensive chronic kidney disease with stage 1 through stage 4 chronic kidney disease, or unspecified chronic kidney disease: Secondary | ICD-10-CM | POA: Diagnosis not present

## 2018-05-16 DIAGNOSIS — Z79899 Other long term (current) drug therapy: Secondary | ICD-10-CM | POA: Insufficient documentation

## 2018-05-16 DIAGNOSIS — G309 Alzheimer's disease, unspecified: Secondary | ICD-10-CM | POA: Insufficient documentation

## 2018-05-16 DIAGNOSIS — S0003XA Contusion of scalp, initial encounter: Secondary | ICD-10-CM | POA: Diagnosis not present

## 2018-05-16 DIAGNOSIS — W19XXXA Unspecified fall, initial encounter: Secondary | ICD-10-CM | POA: Diagnosis not present

## 2018-05-16 DIAGNOSIS — Y92128 Other place in nursing home as the place of occurrence of the external cause: Secondary | ICD-10-CM | POA: Diagnosis not present

## 2018-05-16 DIAGNOSIS — N182 Chronic kidney disease, stage 2 (mild): Secondary | ICD-10-CM | POA: Insufficient documentation

## 2018-05-16 DIAGNOSIS — Z23 Encounter for immunization: Secondary | ICD-10-CM | POA: Insufficient documentation

## 2018-05-16 DIAGNOSIS — S199XXA Unspecified injury of neck, initial encounter: Secondary | ICD-10-CM | POA: Diagnosis not present

## 2018-05-16 DIAGNOSIS — Z87891 Personal history of nicotine dependence: Secondary | ICD-10-CM | POA: Insufficient documentation

## 2018-05-16 DIAGNOSIS — S51812A Laceration without foreign body of left forearm, initial encounter: Secondary | ICD-10-CM | POA: Insufficient documentation

## 2018-05-16 DIAGNOSIS — T148XXA Other injury of unspecified body region, initial encounter: Secondary | ICD-10-CM

## 2018-05-16 DIAGNOSIS — Y998 Other external cause status: Secondary | ICD-10-CM | POA: Insufficient documentation

## 2018-05-16 DIAGNOSIS — S0083XA Contusion of other part of head, initial encounter: Secondary | ICD-10-CM | POA: Insufficient documentation

## 2018-05-16 DIAGNOSIS — S59912A Unspecified injury of left forearm, initial encounter: Secondary | ICD-10-CM | POA: Diagnosis present

## 2018-05-16 DIAGNOSIS — Y939 Activity, unspecified: Secondary | ICD-10-CM | POA: Insufficient documentation

## 2018-05-16 NOTE — ED Triage Notes (Addendum)
Pt brought to ED by her son.  Reports unwitnessed fall around 8pm at [facility].  Pt has large laceration to L forearm and hematoma to L forehead.  Unknown LOC.  No blood thinners.

## 2018-05-16 NOTE — ED Notes (Signed)
Patient in a gown  And on monitor

## 2018-05-17 ENCOUNTER — Emergency Department (HOSPITAL_COMMUNITY): Payer: Medicare Other

## 2018-05-17 DIAGNOSIS — S0003XA Contusion of scalp, initial encounter: Secondary | ICD-10-CM | POA: Diagnosis not present

## 2018-05-17 DIAGNOSIS — S51812A Laceration without foreign body of left forearm, initial encounter: Secondary | ICD-10-CM | POA: Diagnosis not present

## 2018-05-17 DIAGNOSIS — S199XXA Unspecified injury of neck, initial encounter: Secondary | ICD-10-CM | POA: Diagnosis not present

## 2018-05-17 MED ORDER — LIDOCAINE HCL (PF) 1 % IJ SOLN
30.0000 mL | Freq: Once | INTRAMUSCULAR | Status: AC
  Administered 2018-05-17: 30 mL
  Filled 2018-05-17: qty 30

## 2018-05-17 MED ORDER — TETANUS-DIPHTH-ACELL PERTUSSIS 5-2.5-18.5 LF-MCG/0.5 IM SUSP
0.5000 mL | Freq: Once | INTRAMUSCULAR | Status: DC
  Filled 2018-05-17: qty 0.5

## 2018-05-17 MED ORDER — BACITRACIN ZINC 500 UNIT/GM EX OINT: 1.0000 "application " | TOPICAL_OINTMENT | Freq: Two times a day (BID) | CUTANEOUS | 0 refills | Status: AC

## 2018-05-17 MED ORDER — TETANUS-DIPHTH-ACELL PERTUSSIS 5-2.5-18.5 LF-MCG/0.5 IM SUSP
0.5000 mL | Freq: Once | INTRAMUSCULAR | Status: AC
  Administered 2018-05-17: 0.5 mL via INTRAMUSCULAR

## 2018-05-17 MED ORDER — BACITRACIN ZINC 500 UNIT/GM EX OINT: TOPICAL_OINTMENT | Freq: Two times a day (BID) | CUTANEOUS | Status: DC

## 2018-05-17 NOTE — ED Provider Notes (Signed)
LACERATION REPAIR Performed by: Dewaine Oats Authorized by: Dewaine Oats Consent: Verbal consent obtained. Risks and benefits: risks, benefits and alternatives were discussed Consent given by: patient Patient identity confirmed: provided demographic data Prepped and Draped in normal sterile fashion Wound explored  Laceration Location: left forearm  Laceration Length: 18 cm  No Foreign Bodies seen or palpated  Anesthesia: local infiltration  Local anesthetic: lidocaine 1% w/o epinephrine  Anesthetic total: 6 ml  Irrigation method: syringe Amount of cleaning: standard  Skin closure: mixed: 4 simple interrupted sutures with 5-0 vicryl at full thickness areas 4 steri-strips placed at thin, partial thickness areas that are not amenable to sutures   Number of sutures: 4  Technique: simple interrupted  Patient tolerance: Patient tolerated the procedure well with no immediate complications.    Charlann Lange, PA-C 05/17/18 3073    Varney Biles, MD 05/17/18 [phone]

## 2018-05-17 NOTE — Discharge Instructions (Addendum)
We saw you in the ER after you had a fall. All the imaging results are normal, no fractures seen. No evidence of brain bleed.  The laceration over the forearm was repaired with Steri-Strips and dissolvable sutures.  The area there is not another Steri-Strip needs to be dressed twice a day.  Please be very careful with walking, and do everything possible to prevent falls.

## 2018-05-19 DIAGNOSIS — R2681 Unsteadiness on feet: Secondary | ICD-10-CM | POA: Diagnosis not present

## 2018-05-19 DIAGNOSIS — F419 Anxiety disorder, unspecified: Secondary | ICD-10-CM | POA: Diagnosis not present

## 2018-05-19 DIAGNOSIS — Z9181 History of falling: Secondary | ICD-10-CM | POA: Diagnosis not present

## 2018-05-19 DIAGNOSIS — N183 Chronic kidney disease, stage 3 (moderate): Secondary | ICD-10-CM | POA: Diagnosis not present

## 2018-05-19 DIAGNOSIS — M6281 Muscle weakness (generalized): Secondary | ICD-10-CM | POA: Diagnosis not present

## 2018-05-19 DIAGNOSIS — I129 Hypertensive chronic kidney disease with stage 1 through stage 4 chronic kidney disease, or unspecified chronic kidney disease: Secondary | ICD-10-CM | POA: Diagnosis not present

## 2018-05-19 DIAGNOSIS — G309 Alzheimer's disease, unspecified: Secondary | ICD-10-CM | POA: Diagnosis not present

## 2018-05-19 DIAGNOSIS — R41841 Cognitive communication deficit: Secondary | ICD-10-CM | POA: Diagnosis not present

## 2018-05-19 DIAGNOSIS — F0280 Dementia in other diseases classified elsewhere without behavioral disturbance: Secondary | ICD-10-CM | POA: Diagnosis not present

## 2018-05-21 DIAGNOSIS — I129 Hypertensive chronic kidney disease with stage 1 through stage 4 chronic kidney disease, or unspecified chronic kidney disease: Secondary | ICD-10-CM | POA: Diagnosis not present

## 2018-05-21 DIAGNOSIS — N183 Chronic kidney disease, stage 3 (moderate): Secondary | ICD-10-CM | POA: Diagnosis not present

## 2018-05-21 DIAGNOSIS — F0280 Dementia in other diseases classified elsewhere without behavioral disturbance: Secondary | ICD-10-CM | POA: Diagnosis not present

## 2018-05-21 DIAGNOSIS — R2681 Unsteadiness on feet: Secondary | ICD-10-CM | POA: Diagnosis not present

## 2018-05-21 DIAGNOSIS — G309 Alzheimer's disease, unspecified: Secondary | ICD-10-CM | POA: Diagnosis not present

## 2018-05-21 DIAGNOSIS — F419 Anxiety disorder, unspecified: Secondary | ICD-10-CM | POA: Diagnosis not present

## 2018-05-21 NOTE — ED Provider Notes (Signed)
[facility] EMERGENCY DEPARTMENT Provider Note   CSN: [id number] Arrival date & time: 05/16/18  2114     History   Chief Complaint Chief Complaint  Patient presents with  . Fall  . Extremity Laceration  . Head Injury    HPI Christina Atkinson is a 85 y.o. female.  HPI Level 5 caveat for severe dementia  85 year old female with history of dementia, CKD, hypertension comes in with chief complaint of fall.  Patient sons at the bedside.  They report that patient was just moved to a new memory unit facility earlier in the day and she ended up with an unwitnessed fall.  Patient is noted to have large forehead hematoma on the left side and also laceration to her left upper extremity.  Patient is not on any blood thinners.  Past Medical History:  Diagnosis Date  . Alzheimer's dementia with behavioral disturbance ([place])    per Dr. Marco Collie 08/2015  . Anxiety   . Chronic diarrhea   . CKD (chronic kidney disease), stage II    per medical record per Dr. Marco Collie   . Colon polyps   . Dementia ([place])   . HTN (hypertension)     Patient Active Problem List   Diagnosis Date Noted  . History of colon polyps 08/15/2016  . CKD (chronic kidney disease), stage II   . Alzheimer's dementia with behavioral disturbance ([place])   . HTN (hypertension) 01/23/2016  . Dementia ([place]) 01/23/2016  . Chronic diarrhea of unknown origin 01/23/2016  . Rhinorrhea 01/23/2016    Past Surgical History:  Procedure Laterality Date  . CESAREAN SECTION    . TONSILLECTOMY       OB History   No obstetric history on file.      Home Medications    Prior to Admission medications   Medication Sig Start Date End Date Taking? Authorizing Provider  acetaminophen (TYLENOL) 325 MG tablet Take 325 mg by mouth 2 (two) times daily. Knee pain   Yes [provider]  Calcium Carbonate-Vitamin D3 (CALCIUM 600/VITAMIN D) 600-400 MG-UNIT TABS Take 1 tablet by mouth 2 (two) times daily.   Yes  [provider]  diclofenac sodium (VOLTAREN) 1 % GEL Apply 4 g topically 3 (three) times daily. To the right knww    Yes [provider]  escitalopram (LEXAPRO) 10 MG tablet Take 10 mg by mouth daily.   Yes [provider]  Glucosamine 500 MG CAPS Take 1,500 mg by mouth daily.   Yes [provider]  latanoprost (XALATAN) 0.005 % ophthalmic solution Place 1 drop into both eyes at bedtime.   Yes [provider]  lisinopril (PRINIVIL,ZESTRIL) 5 MG tablet Take 5 mg by mouth daily.   Yes [provider]  LORazepam (ATIVAN) 0.5 MG tablet Take 0.5 mg by mouth 2 (two) times daily.   Yes [provider]  LORazepam (ATIVAN) 0.5 MG tablet Take 0.25 mg by mouth 2 (two) times daily as needed for anxiety.   Yes [provider]  Melatonin 10 MG CAPS Take 10 mg by mouth at bedtime.   Yes [provider]  memantine (NAMENDA) 10 MG tablet Take 5-10 mg by mouth 2 (two) times daily.    Yes [provider]  polyethylene glycol (MIRALAX / GLYCOLAX) packet Take 17 g by mouth daily as needed for mild constipation.   Yes [provider]  traMADol (ULTRAM) 50 MG tablet Take 50 mg by mouth 2 (two) times daily.  Yes [provider]  vitamin B-12 (CYANOCOBALAMIN) 100 MCG tablet Take 100 mcg by mouth daily.   Yes [provider]  atenolol-chlorthalidone (TENORETIC) 100-25 MG tablet TAKE 1 TABLET BY MOUTH EVERY DAY Patient not taking: Reported on 02/20/2018 01/16/17   Harland Dingwall L, NP-C  bacitracin ointment Apply 1 application topically 2 (two) times daily. 05/17/18   Varney Biles, MD  Budesonide 9 MG TB24 Take 1 tablet by mouth daily. Patient not taking: Reported on 02/20/2018 10/12/16   Nelida Meuse III, MD  donepezil (ARICEPT) 10 MG tablet TAKE 1 TABLET BY MOUTH ONCE DAILY Patient not taking: Reported on 02/20/2018 04/09/17   Star Age, MD  memantine (NAMENDA XR) 14 MG CP24 24 hr capsule Take 1  capsule (14 mg total) by mouth daily. Patient not taking: Reported on 02/20/2018 11/08/16   Star Age, MD    Family History Family History  Problem Relation Age of Onset  . Heart attack Father   . Alzheimer's disease Sister     Social History Social History   Tobacco Use  . Smoking status: Former Smoker    Last attempt to quit: 09/14/1986    Years since quitting: 31.7  . Smokeless tobacco: Never Used  Substance Use Topics  . Alcohol use: No  . Drug use: No     Allergies   Codeine   Review of Systems Review of Systems  Unable to perform ROS: Dementia     Physical Exam Updated Vital Signs BP (!) 169/74 (BP Location: Right Arm)   Pulse 72   Temp 98.2 F (36.8 C)   Resp 11   SpO2 97%   Physical Exam Vitals signs and nursing note reviewed.  Constitutional:      Appearance: She is well-developed.  HENT:     Head: Normocephalic and atraumatic.  Eyes:     Pupils: Pupils are equal, round, and reactive to light.  Neck:     Musculoskeletal: Neck supple.  Cardiovascular:     Rate and Rhythm: Normal rate and regular rhythm.     Heart sounds: Normal heart sounds.  Pulmonary:     Effort: Pulmonary effort is normal. No respiratory distress.  Abdominal:     General: There is no distension.     Palpations: Abdomen is soft.     Tenderness: There is no abdominal tenderness. There is no guarding or rebound.  Musculoskeletal:        General: Tenderness and signs of injury present. No deformity.     Comments: Patient has a large hematoma to the left side of her forehead and also a large laceration in her left upper extremity.  Otherwise there is no gross deformity, tenderness to palpation  Skin:    General: Skin is warm and dry.  Neurological:     Mental Status: She is alert. Mental status is at baseline.      ED Treatments / Results  Labs (all labs ordered are listed, but only abnormal results are displayed) Labs Reviewed - No data to  display  EKG None  Radiology No results found.  Procedures Procedures (including critical care time)  Medications Ordered in ED Medications  Tdap (BOOSTRIX) injection 0.5 mL (0.5 mLs Intramuscular Given 05/17/18 0100)  lidocaine (PF) (XYLOCAINE) 1 % injection 30 mL (30 mLs Infiltration Given by Other 05/17/18 0101)     Initial Impression / Assessment and Plan / ED Course  I have reviewed the triage vital signs and the nursing notes.  Pertinent labs &  imaging results that were available during my care of the patient were reviewed by me and considered in my medical decision making (see chart for details).     85 year old female comes in a chief complaint of fall. She has history of fall risk because of her dementia.  She is at a new facility and had a fall -the lack of familiarity of the new location might have played a role.  She has a large hematoma to her forehead.  Sounds would want a CAT scan given that they might agree to intervention if needed CT head was negative for any acute findings.  She also has a large laceration to her upper extremity which APP will repair.  Tetanus will be administered here.  Wound care precautions have been discussed.  Final Clinical Impressions(s) / ED Diagnoses   Final diagnoses:  Fall, initial encounter  Laceration of left forearm, initial encounter  Hematoma    ED Discharge Orders         Ordered    bacitracin ointment  2 times daily     05/17/18 0352           Varney Biles, MD 05/21/18 0932

## 2018-05-23 DIAGNOSIS — F419 Anxiety disorder, unspecified: Secondary | ICD-10-CM | POA: Diagnosis not present

## 2018-05-23 DIAGNOSIS — I129 Hypertensive chronic kidney disease with stage 1 through stage 4 chronic kidney disease, or unspecified chronic kidney disease: Secondary | ICD-10-CM | POA: Diagnosis not present

## 2018-05-23 DIAGNOSIS — R2681 Unsteadiness on feet: Secondary | ICD-10-CM | POA: Diagnosis not present

## 2018-05-23 DIAGNOSIS — G309 Alzheimer's disease, unspecified: Secondary | ICD-10-CM | POA: Diagnosis not present

## 2018-05-23 DIAGNOSIS — F0280 Dementia in other diseases classified elsewhere without behavioral disturbance: Secondary | ICD-10-CM | POA: Diagnosis not present

## 2018-05-23 DIAGNOSIS — N183 Chronic kidney disease, stage 3 (moderate): Secondary | ICD-10-CM | POA: Diagnosis not present

## 2018-05-27 DIAGNOSIS — F0390 Unspecified dementia without behavioral disturbance: Secondary | ICD-10-CM | POA: Diagnosis not present

## 2018-05-27 DIAGNOSIS — F419 Anxiety disorder, unspecified: Secondary | ICD-10-CM | POA: Diagnosis not present

## 2018-05-28 DIAGNOSIS — G309 Alzheimer's disease, unspecified: Secondary | ICD-10-CM | POA: Diagnosis not present

## 2018-05-28 DIAGNOSIS — F0280 Dementia in other diseases classified elsewhere without behavioral disturbance: Secondary | ICD-10-CM | POA: Diagnosis not present

## 2018-05-28 DIAGNOSIS — I129 Hypertensive chronic kidney disease with stage 1 through stage 4 chronic kidney disease, or unspecified chronic kidney disease: Secondary | ICD-10-CM | POA: Diagnosis not present

## 2018-05-28 DIAGNOSIS — F419 Anxiety disorder, unspecified: Secondary | ICD-10-CM | POA: Diagnosis not present

## 2018-05-28 DIAGNOSIS — R2681 Unsteadiness on feet: Secondary | ICD-10-CM | POA: Diagnosis not present

## 2018-05-28 DIAGNOSIS — N183 Chronic kidney disease, stage 3 (moderate): Secondary | ICD-10-CM | POA: Diagnosis not present

## 2018-05-29 DIAGNOSIS — F0280 Dementia in other diseases classified elsewhere without behavioral disturbance: Secondary | ICD-10-CM | POA: Diagnosis not present

## 2018-05-29 DIAGNOSIS — G309 Alzheimer's disease, unspecified: Secondary | ICD-10-CM | POA: Diagnosis not present

## 2018-05-29 DIAGNOSIS — N183 Chronic kidney disease, stage 3 (moderate): Secondary | ICD-10-CM | POA: Diagnosis not present

## 2018-05-29 DIAGNOSIS — I129 Hypertensive chronic kidney disease with stage 1 through stage 4 chronic kidney disease, or unspecified chronic kidney disease: Secondary | ICD-10-CM | POA: Diagnosis not present

## 2018-05-29 DIAGNOSIS — F419 Anxiety disorder, unspecified: Secondary | ICD-10-CM | POA: Diagnosis not present

## 2018-05-29 DIAGNOSIS — R2681 Unsteadiness on feet: Secondary | ICD-10-CM | POA: Diagnosis not present

## 2018-05-30 DIAGNOSIS — F0280 Dementia in other diseases classified elsewhere without behavioral disturbance: Secondary | ICD-10-CM | POA: Diagnosis not present

## 2018-05-30 DIAGNOSIS — F064 Anxiety disorder due to known physiological condition: Secondary | ICD-10-CM | POA: Diagnosis not present

## 2018-05-30 DIAGNOSIS — G301 Alzheimer's disease with late onset: Secondary | ICD-10-CM | POA: Diagnosis not present

## 2018-05-30 DIAGNOSIS — F419 Anxiety disorder, unspecified: Secondary | ICD-10-CM | POA: Diagnosis not present

## 2018-05-30 DIAGNOSIS — R2681 Unsteadiness on feet: Secondary | ICD-10-CM | POA: Diagnosis not present

## 2018-05-30 DIAGNOSIS — I129 Hypertensive chronic kidney disease with stage 1 through stage 4 chronic kidney disease, or unspecified chronic kidney disease: Secondary | ICD-10-CM | POA: Diagnosis not present

## 2018-05-30 DIAGNOSIS — G309 Alzheimer's disease, unspecified: Secondary | ICD-10-CM | POA: Diagnosis not present

## 2018-05-30 DIAGNOSIS — G4701 Insomnia due to medical condition: Secondary | ICD-10-CM | POA: Diagnosis not present

## 2018-05-30 DIAGNOSIS — N183 Chronic kidney disease, stage 3 (moderate): Secondary | ICD-10-CM | POA: Diagnosis not present

## 2018-05-30 DIAGNOSIS — F331 Major depressive disorder, recurrent, moderate: Secondary | ICD-10-CM | POA: Diagnosis not present

## 2018-05-31 DIAGNOSIS — M545 Low back pain: Secondary | ICD-10-CM | POA: Diagnosis not present

## 2018-05-31 DIAGNOSIS — R102 Pelvic and perineal pain: Secondary | ICD-10-CM | POA: Diagnosis not present

## 2018-06-02 DIAGNOSIS — F419 Anxiety disorder, unspecified: Secondary | ICD-10-CM | POA: Diagnosis not present

## 2018-06-02 DIAGNOSIS — N183 Chronic kidney disease, stage 3 (moderate): Secondary | ICD-10-CM | POA: Diagnosis not present

## 2018-06-02 DIAGNOSIS — G309 Alzheimer's disease, unspecified: Secondary | ICD-10-CM | POA: Diagnosis not present

## 2018-06-02 DIAGNOSIS — F0280 Dementia in other diseases classified elsewhere without behavioral disturbance: Secondary | ICD-10-CM | POA: Diagnosis not present

## 2018-06-02 DIAGNOSIS — R2681 Unsteadiness on feet: Secondary | ICD-10-CM | POA: Diagnosis not present

## 2018-06-02 DIAGNOSIS — I129 Hypertensive chronic kidney disease with stage 1 through stage 4 chronic kidney disease, or unspecified chronic kidney disease: Secondary | ICD-10-CM | POA: Diagnosis not present

## 2018-06-03 DIAGNOSIS — I129 Hypertensive chronic kidney disease with stage 1 through stage 4 chronic kidney disease, or unspecified chronic kidney disease: Secondary | ICD-10-CM | POA: Diagnosis not present

## 2018-06-03 DIAGNOSIS — G309 Alzheimer's disease, unspecified: Secondary | ICD-10-CM | POA: Diagnosis not present

## 2018-06-03 DIAGNOSIS — F0280 Dementia in other diseases classified elsewhere without behavioral disturbance: Secondary | ICD-10-CM | POA: Diagnosis not present

## 2018-06-03 DIAGNOSIS — R2681 Unsteadiness on feet: Secondary | ICD-10-CM | POA: Diagnosis not present

## 2018-06-03 DIAGNOSIS — N183 Chronic kidney disease, stage 3 (moderate): Secondary | ICD-10-CM | POA: Diagnosis not present

## 2018-06-03 DIAGNOSIS — F419 Anxiety disorder, unspecified: Secondary | ICD-10-CM | POA: Diagnosis not present

## 2018-06-04 DIAGNOSIS — N183 Chronic kidney disease, stage 3 (moderate): Secondary | ICD-10-CM | POA: Diagnosis not present

## 2018-06-04 DIAGNOSIS — R2681 Unsteadiness on feet: Secondary | ICD-10-CM | POA: Diagnosis not present

## 2018-06-04 DIAGNOSIS — I129 Hypertensive chronic kidney disease with stage 1 through stage 4 chronic kidney disease, or unspecified chronic kidney disease: Secondary | ICD-10-CM | POA: Diagnosis not present

## 2018-06-04 DIAGNOSIS — G309 Alzheimer's disease, unspecified: Secondary | ICD-10-CM | POA: Diagnosis not present

## 2018-06-04 DIAGNOSIS — F0280 Dementia in other diseases classified elsewhere without behavioral disturbance: Secondary | ICD-10-CM | POA: Diagnosis not present

## 2018-06-04 DIAGNOSIS — F419 Anxiety disorder, unspecified: Secondary | ICD-10-CM | POA: Diagnosis not present

## 2018-06-05 DIAGNOSIS — R300 Dysuria: Secondary | ICD-10-CM | POA: Diagnosis not present

## 2018-06-06 DIAGNOSIS — F419 Anxiety disorder, unspecified: Secondary | ICD-10-CM | POA: Diagnosis not present

## 2018-06-06 DIAGNOSIS — D518 Other vitamin B12 deficiency anemias: Secondary | ICD-10-CM | POA: Diagnosis not present

## 2018-06-06 DIAGNOSIS — N183 Chronic kidney disease, stage 3 (moderate): Secondary | ICD-10-CM | POA: Diagnosis not present

## 2018-06-06 DIAGNOSIS — G309 Alzheimer's disease, unspecified: Secondary | ICD-10-CM | POA: Diagnosis not present

## 2018-06-06 DIAGNOSIS — H4010X0 Unspecified open-angle glaucoma, stage unspecified: Secondary | ICD-10-CM | POA: Diagnosis not present

## 2018-06-06 DIAGNOSIS — K5900 Constipation, unspecified: Secondary | ICD-10-CM | POA: Diagnosis not present

## 2018-06-06 DIAGNOSIS — M1991 Primary osteoarthritis, unspecified site: Secondary | ICD-10-CM | POA: Diagnosis not present

## 2018-06-06 DIAGNOSIS — G894 Chronic pain syndrome: Secondary | ICD-10-CM | POA: Diagnosis not present

## 2018-06-06 DIAGNOSIS — R2681 Unsteadiness on feet: Secondary | ICD-10-CM | POA: Diagnosis not present

## 2018-06-06 DIAGNOSIS — I129 Hypertensive chronic kidney disease with stage 1 through stage 4 chronic kidney disease, or unspecified chronic kidney disease: Secondary | ICD-10-CM | POA: Diagnosis not present

## 2018-06-06 DIAGNOSIS — I10 Essential (primary) hypertension: Secondary | ICD-10-CM | POA: Diagnosis not present

## 2018-06-06 DIAGNOSIS — F0280 Dementia in other diseases classified elsewhere without behavioral disturbance: Secondary | ICD-10-CM | POA: Diagnosis not present

## 2018-06-10 DIAGNOSIS — F0280 Dementia in other diseases classified elsewhere without behavioral disturbance: Secondary | ICD-10-CM | POA: Diagnosis not present

## 2018-06-10 DIAGNOSIS — R2681 Unsteadiness on feet: Secondary | ICD-10-CM | POA: Diagnosis not present

## 2018-06-10 DIAGNOSIS — G309 Alzheimer's disease, unspecified: Secondary | ICD-10-CM | POA: Diagnosis not present

## 2018-06-10 DIAGNOSIS — I129 Hypertensive chronic kidney disease with stage 1 through stage 4 chronic kidney disease, or unspecified chronic kidney disease: Secondary | ICD-10-CM | POA: Diagnosis not present

## 2018-06-10 DIAGNOSIS — F419 Anxiety disorder, unspecified: Secondary | ICD-10-CM | POA: Diagnosis not present

## 2018-06-10 DIAGNOSIS — N183 Chronic kidney disease, stage 3 (moderate): Secondary | ICD-10-CM | POA: Diagnosis not present

## 2018-06-11 DIAGNOSIS — R2681 Unsteadiness on feet: Secondary | ICD-10-CM | POA: Diagnosis not present

## 2018-06-11 DIAGNOSIS — G309 Alzheimer's disease, unspecified: Secondary | ICD-10-CM | POA: Diagnosis not present

## 2018-06-11 DIAGNOSIS — I129 Hypertensive chronic kidney disease with stage 1 through stage 4 chronic kidney disease, or unspecified chronic kidney disease: Secondary | ICD-10-CM | POA: Diagnosis not present

## 2018-06-11 DIAGNOSIS — F0280 Dementia in other diseases classified elsewhere without behavioral disturbance: Secondary | ICD-10-CM | POA: Diagnosis not present

## 2018-06-11 DIAGNOSIS — N183 Chronic kidney disease, stage 3 (moderate): Secondary | ICD-10-CM | POA: Diagnosis not present

## 2018-06-11 DIAGNOSIS — F419 Anxiety disorder, unspecified: Secondary | ICD-10-CM | POA: Diagnosis not present

## 2018-06-12 DIAGNOSIS — N183 Chronic kidney disease, stage 3 (moderate): Secondary | ICD-10-CM | POA: Diagnosis not present

## 2018-06-12 DIAGNOSIS — F0280 Dementia in other diseases classified elsewhere without behavioral disturbance: Secondary | ICD-10-CM | POA: Diagnosis not present

## 2018-06-12 DIAGNOSIS — F419 Anxiety disorder, unspecified: Secondary | ICD-10-CM | POA: Diagnosis not present

## 2018-06-12 DIAGNOSIS — G309 Alzheimer's disease, unspecified: Secondary | ICD-10-CM | POA: Diagnosis not present

## 2018-06-12 DIAGNOSIS — I129 Hypertensive chronic kidney disease with stage 1 through stage 4 chronic kidney disease, or unspecified chronic kidney disease: Secondary | ICD-10-CM | POA: Diagnosis not present

## 2018-06-12 DIAGNOSIS — R2681 Unsteadiness on feet: Secondary | ICD-10-CM | POA: Diagnosis not present

## 2018-06-13 DIAGNOSIS — G309 Alzheimer's disease, unspecified: Secondary | ICD-10-CM | POA: Diagnosis not present

## 2018-06-13 DIAGNOSIS — F419 Anxiety disorder, unspecified: Secondary | ICD-10-CM | POA: Diagnosis not present

## 2018-06-13 DIAGNOSIS — N183 Chronic kidney disease, stage 3 (moderate): Secondary | ICD-10-CM | POA: Diagnosis not present

## 2018-06-13 DIAGNOSIS — R2681 Unsteadiness on feet: Secondary | ICD-10-CM | POA: Diagnosis not present

## 2018-06-13 DIAGNOSIS — I129 Hypertensive chronic kidney disease with stage 1 through stage 4 chronic kidney disease, or unspecified chronic kidney disease: Secondary | ICD-10-CM | POA: Diagnosis not present

## 2018-06-13 DIAGNOSIS — F0280 Dementia in other diseases classified elsewhere without behavioral disturbance: Secondary | ICD-10-CM | POA: Diagnosis not present

## 2018-06-16 DIAGNOSIS — F0280 Dementia in other diseases classified elsewhere without behavioral disturbance: Secondary | ICD-10-CM | POA: Diagnosis not present

## 2018-06-16 DIAGNOSIS — N183 Chronic kidney disease, stage 3 (moderate): Secondary | ICD-10-CM | POA: Diagnosis not present

## 2018-06-16 DIAGNOSIS — F419 Anxiety disorder, unspecified: Secondary | ICD-10-CM | POA: Diagnosis not present

## 2018-06-16 DIAGNOSIS — R2681 Unsteadiness on feet: Secondary | ICD-10-CM | POA: Diagnosis not present

## 2018-06-16 DIAGNOSIS — G309 Alzheimer's disease, unspecified: Secondary | ICD-10-CM | POA: Diagnosis not present

## 2018-06-16 DIAGNOSIS — I129 Hypertensive chronic kidney disease with stage 1 through stage 4 chronic kidney disease, or unspecified chronic kidney disease: Secondary | ICD-10-CM | POA: Diagnosis not present

## 2018-06-17 DIAGNOSIS — I129 Hypertensive chronic kidney disease with stage 1 through stage 4 chronic kidney disease, or unspecified chronic kidney disease: Secondary | ICD-10-CM | POA: Diagnosis not present

## 2018-06-17 DIAGNOSIS — F419 Anxiety disorder, unspecified: Secondary | ICD-10-CM | POA: Diagnosis not present

## 2018-06-17 DIAGNOSIS — G309 Alzheimer's disease, unspecified: Secondary | ICD-10-CM | POA: Diagnosis not present

## 2018-06-17 DIAGNOSIS — F0280 Dementia in other diseases classified elsewhere without behavioral disturbance: Secondary | ICD-10-CM | POA: Diagnosis not present

## 2018-06-17 DIAGNOSIS — R2681 Unsteadiness on feet: Secondary | ICD-10-CM | POA: Diagnosis not present

## 2018-06-17 DIAGNOSIS — N183 Chronic kidney disease, stage 3 (moderate): Secondary | ICD-10-CM | POA: Diagnosis not present

## 2018-06-18 DIAGNOSIS — I129 Hypertensive chronic kidney disease with stage 1 through stage 4 chronic kidney disease, or unspecified chronic kidney disease: Secondary | ICD-10-CM | POA: Diagnosis not present

## 2018-06-18 DIAGNOSIS — G309 Alzheimer's disease, unspecified: Secondary | ICD-10-CM | POA: Diagnosis not present

## 2018-06-18 DIAGNOSIS — R41841 Cognitive communication deficit: Secondary | ICD-10-CM | POA: Diagnosis not present

## 2018-06-18 DIAGNOSIS — Z9181 History of falling: Secondary | ICD-10-CM | POA: Diagnosis not present

## 2018-06-18 DIAGNOSIS — R2681 Unsteadiness on feet: Secondary | ICD-10-CM | POA: Diagnosis not present

## 2018-06-18 DIAGNOSIS — F419 Anxiety disorder, unspecified: Secondary | ICD-10-CM | POA: Diagnosis not present

## 2018-06-18 DIAGNOSIS — N183 Chronic kidney disease, stage 3 (moderate): Secondary | ICD-10-CM | POA: Diagnosis not present

## 2018-06-18 DIAGNOSIS — F0280 Dementia in other diseases classified elsewhere without behavioral disturbance: Secondary | ICD-10-CM | POA: Diagnosis not present

## 2018-06-18 DIAGNOSIS — M6281 Muscle weakness (generalized): Secondary | ICD-10-CM | POA: Diagnosis not present

## 2018-06-19 DIAGNOSIS — I129 Hypertensive chronic kidney disease with stage 1 through stage 4 chronic kidney disease, or unspecified chronic kidney disease: Secondary | ICD-10-CM | POA: Diagnosis not present

## 2018-06-19 DIAGNOSIS — F419 Anxiety disorder, unspecified: Secondary | ICD-10-CM | POA: Diagnosis not present

## 2018-06-19 DIAGNOSIS — R2681 Unsteadiness on feet: Secondary | ICD-10-CM | POA: Diagnosis not present

## 2018-06-19 DIAGNOSIS — G309 Alzheimer's disease, unspecified: Secondary | ICD-10-CM | POA: Diagnosis not present

## 2018-06-19 DIAGNOSIS — F0280 Dementia in other diseases classified elsewhere without behavioral disturbance: Secondary | ICD-10-CM | POA: Diagnosis not present

## 2018-06-19 DIAGNOSIS — N183 Chronic kidney disease, stage 3 (moderate): Secondary | ICD-10-CM | POA: Diagnosis not present

## 2018-06-25 DIAGNOSIS — F0280 Dementia in other diseases classified elsewhere without behavioral disturbance: Secondary | ICD-10-CM | POA: Diagnosis not present

## 2018-06-25 DIAGNOSIS — R2681 Unsteadiness on feet: Secondary | ICD-10-CM | POA: Diagnosis not present

## 2018-06-25 DIAGNOSIS — G309 Alzheimer's disease, unspecified: Secondary | ICD-10-CM | POA: Diagnosis not present

## 2018-06-25 DIAGNOSIS — I129 Hypertensive chronic kidney disease with stage 1 through stage 4 chronic kidney disease, or unspecified chronic kidney disease: Secondary | ICD-10-CM | POA: Diagnosis not present

## 2018-06-25 DIAGNOSIS — N183 Chronic kidney disease, stage 3 (moderate): Secondary | ICD-10-CM | POA: Diagnosis not present

## 2018-06-25 DIAGNOSIS — F419 Anxiety disorder, unspecified: Secondary | ICD-10-CM | POA: Diagnosis not present

## 2018-06-27 DIAGNOSIS — G309 Alzheimer's disease, unspecified: Secondary | ICD-10-CM | POA: Diagnosis not present

## 2018-06-27 DIAGNOSIS — E559 Vitamin D deficiency, unspecified: Secondary | ICD-10-CM | POA: Diagnosis not present

## 2018-06-27 DIAGNOSIS — F064 Anxiety disorder due to known physiological condition: Secondary | ICD-10-CM | POA: Diagnosis not present

## 2018-06-27 DIAGNOSIS — D518 Other vitamin B12 deficiency anemias: Secondary | ICD-10-CM | POA: Diagnosis not present

## 2018-06-27 DIAGNOSIS — R2681 Unsteadiness on feet: Secondary | ICD-10-CM | POA: Diagnosis not present

## 2018-06-27 DIAGNOSIS — E039 Hypothyroidism, unspecified: Secondary | ICD-10-CM | POA: Diagnosis not present

## 2018-06-27 DIAGNOSIS — F419 Anxiety disorder, unspecified: Secondary | ICD-10-CM | POA: Diagnosis not present

## 2018-06-27 DIAGNOSIS — E119 Type 2 diabetes mellitus without complications: Secondary | ICD-10-CM | POA: Diagnosis not present

## 2018-06-27 DIAGNOSIS — F0280 Dementia in other diseases classified elsewhere without behavioral disturbance: Secondary | ICD-10-CM | POA: Diagnosis not present

## 2018-06-27 DIAGNOSIS — N183 Chronic kidney disease, stage 3 (moderate): Secondary | ICD-10-CM | POA: Diagnosis not present

## 2018-06-27 DIAGNOSIS — E782 Mixed hyperlipidemia: Secondary | ICD-10-CM | POA: Diagnosis not present

## 2018-06-27 DIAGNOSIS — G301 Alzheimer's disease with late onset: Secondary | ICD-10-CM | POA: Diagnosis not present

## 2018-06-27 DIAGNOSIS — I10 Essential (primary) hypertension: Secondary | ICD-10-CM | POA: Diagnosis not present

## 2018-06-27 DIAGNOSIS — F331 Major depressive disorder, recurrent, moderate: Secondary | ICD-10-CM | POA: Diagnosis not present

## 2018-06-27 DIAGNOSIS — G4701 Insomnia due to medical condition: Secondary | ICD-10-CM | POA: Diagnosis not present

## 2018-06-27 DIAGNOSIS — I129 Hypertensive chronic kidney disease with stage 1 through stage 4 chronic kidney disease, or unspecified chronic kidney disease: Secondary | ICD-10-CM | POA: Diagnosis not present

## 2018-06-28 DIAGNOSIS — T148XXA Other injury of unspecified body region, initial encounter: Secondary | ICD-10-CM | POA: Diagnosis not present

## 2018-07-02 DIAGNOSIS — R296 Repeated falls: Secondary | ICD-10-CM | POA: Diagnosis not present

## 2018-07-02 DIAGNOSIS — E785 Hyperlipidemia, unspecified: Secondary | ICD-10-CM | POA: Diagnosis not present

## 2018-07-02 DIAGNOSIS — F0281 Dementia in other diseases classified elsewhere with behavioral disturbance: Secondary | ICD-10-CM | POA: Diagnosis not present

## 2018-07-02 DIAGNOSIS — F418 Other specified anxiety disorders: Secondary | ICD-10-CM | POA: Diagnosis not present

## 2018-07-02 DIAGNOSIS — M810 Age-related osteoporosis without current pathological fracture: Secondary | ICD-10-CM | POA: Diagnosis not present

## 2018-07-02 DIAGNOSIS — S32000D Wedge compression fracture of unspecified lumbar vertebra, subsequent encounter for fracture with routine healing: Secondary | ICD-10-CM | POA: Diagnosis not present

## 2018-07-02 DIAGNOSIS — G309 Alzheimer's disease, unspecified: Secondary | ICD-10-CM | POA: Diagnosis not present

## 2018-07-02 DIAGNOSIS — I739 Peripheral vascular disease, unspecified: Secondary | ICD-10-CM | POA: Diagnosis not present

## 2018-07-02 DIAGNOSIS — I10 Essential (primary) hypertension: Secondary | ICD-10-CM | POA: Diagnosis not present

## 2018-07-02 DIAGNOSIS — H409 Unspecified glaucoma: Secondary | ICD-10-CM | POA: Diagnosis not present

## 2018-07-03 DIAGNOSIS — R296 Repeated falls: Secondary | ICD-10-CM | POA: Diagnosis not present

## 2018-07-03 DIAGNOSIS — E785 Hyperlipidemia, unspecified: Secondary | ICD-10-CM | POA: Diagnosis not present

## 2018-07-03 DIAGNOSIS — I10 Essential (primary) hypertension: Secondary | ICD-10-CM | POA: Diagnosis not present

## 2018-07-03 DIAGNOSIS — I739 Peripheral vascular disease, unspecified: Secondary | ICD-10-CM | POA: Diagnosis not present

## 2018-07-03 DIAGNOSIS — G309 Alzheimer's disease, unspecified: Secondary | ICD-10-CM | POA: Diagnosis not present

## 2018-07-03 DIAGNOSIS — F0281 Dementia in other diseases classified elsewhere with behavioral disturbance: Secondary | ICD-10-CM | POA: Diagnosis not present

## 2018-07-04 ENCOUNTER — Encounter (HOSPITAL_BASED_OUTPATIENT_CLINIC_OR_DEPARTMENT_OTHER): Payer: Medicare Other | Attending: Internal Medicine

## 2018-07-04 DIAGNOSIS — D518 Other vitamin B12 deficiency anemias: Secondary | ICD-10-CM | POA: Diagnosis not present

## 2018-07-04 DIAGNOSIS — E785 Hyperlipidemia, unspecified: Secondary | ICD-10-CM | POA: Diagnosis not present

## 2018-07-04 DIAGNOSIS — F0281 Dementia in other diseases classified elsewhere with behavioral disturbance: Secondary | ICD-10-CM | POA: Diagnosis not present

## 2018-07-04 DIAGNOSIS — K5900 Constipation, unspecified: Secondary | ICD-10-CM | POA: Diagnosis not present

## 2018-07-04 DIAGNOSIS — H4010X0 Unspecified open-angle glaucoma, stage unspecified: Secondary | ICD-10-CM | POA: Diagnosis not present

## 2018-07-04 DIAGNOSIS — R296 Repeated falls: Secondary | ICD-10-CM | POA: Diagnosis not present

## 2018-07-04 DIAGNOSIS — I739 Peripheral vascular disease, unspecified: Secondary | ICD-10-CM | POA: Diagnosis not present

## 2018-07-04 DIAGNOSIS — G309 Alzheimer's disease, unspecified: Secondary | ICD-10-CM | POA: Diagnosis not present

## 2018-07-04 DIAGNOSIS — G894 Chronic pain syndrome: Secondary | ICD-10-CM | POA: Diagnosis not present

## 2018-07-04 DIAGNOSIS — M1991 Primary osteoarthritis, unspecified site: Secondary | ICD-10-CM | POA: Diagnosis not present

## 2018-07-04 DIAGNOSIS — I10 Essential (primary) hypertension: Secondary | ICD-10-CM | POA: Diagnosis not present

## 2018-07-07 DIAGNOSIS — E785 Hyperlipidemia, unspecified: Secondary | ICD-10-CM | POA: Diagnosis not present

## 2018-07-07 DIAGNOSIS — F0281 Dementia in other diseases classified elsewhere with behavioral disturbance: Secondary | ICD-10-CM | POA: Diagnosis not present

## 2018-07-07 DIAGNOSIS — R296 Repeated falls: Secondary | ICD-10-CM | POA: Diagnosis not present

## 2018-07-07 DIAGNOSIS — I10 Essential (primary) hypertension: Secondary | ICD-10-CM | POA: Diagnosis not present

## 2018-07-07 DIAGNOSIS — G309 Alzheimer's disease, unspecified: Secondary | ICD-10-CM | POA: Diagnosis not present

## 2018-07-07 DIAGNOSIS — I739 Peripheral vascular disease, unspecified: Secondary | ICD-10-CM | POA: Diagnosis not present

## 2018-07-09 ENCOUNTER — Encounter (HOSPITAL_BASED_OUTPATIENT_CLINIC_OR_DEPARTMENT_OTHER): Payer: Medicare Other | Attending: Internal Medicine

## 2018-07-09 DIAGNOSIS — F419 Anxiety disorder, unspecified: Secondary | ICD-10-CM | POA: Diagnosis not present

## 2018-07-11 DIAGNOSIS — R296 Repeated falls: Secondary | ICD-10-CM | POA: Diagnosis not present

## 2018-07-11 DIAGNOSIS — E785 Hyperlipidemia, unspecified: Secondary | ICD-10-CM | POA: Diagnosis not present

## 2018-07-11 DIAGNOSIS — F0281 Dementia in other diseases classified elsewhere with behavioral disturbance: Secondary | ICD-10-CM | POA: Diagnosis not present

## 2018-07-11 DIAGNOSIS — I10 Essential (primary) hypertension: Secondary | ICD-10-CM | POA: Diagnosis not present

## 2018-07-11 DIAGNOSIS — I739 Peripheral vascular disease, unspecified: Secondary | ICD-10-CM | POA: Diagnosis not present

## 2018-07-11 DIAGNOSIS — G309 Alzheimer's disease, unspecified: Secondary | ICD-10-CM | POA: Diagnosis not present

## 2018-07-14 DIAGNOSIS — G309 Alzheimer's disease, unspecified: Secondary | ICD-10-CM | POA: Diagnosis not present

## 2018-07-14 DIAGNOSIS — I10 Essential (primary) hypertension: Secondary | ICD-10-CM | POA: Diagnosis not present

## 2018-07-14 DIAGNOSIS — F0281 Dementia in other diseases classified elsewhere with behavioral disturbance: Secondary | ICD-10-CM | POA: Diagnosis not present

## 2018-07-14 DIAGNOSIS — I739 Peripheral vascular disease, unspecified: Secondary | ICD-10-CM | POA: Diagnosis not present

## 2018-07-14 DIAGNOSIS — R296 Repeated falls: Secondary | ICD-10-CM | POA: Diagnosis not present

## 2018-07-14 DIAGNOSIS — E785 Hyperlipidemia, unspecified: Secondary | ICD-10-CM | POA: Diagnosis not present

## 2018-07-16 DIAGNOSIS — I739 Peripheral vascular disease, unspecified: Secondary | ICD-10-CM | POA: Diagnosis not present

## 2018-07-16 DIAGNOSIS — E785 Hyperlipidemia, unspecified: Secondary | ICD-10-CM | POA: Diagnosis not present

## 2018-07-16 DIAGNOSIS — F0281 Dementia in other diseases classified elsewhere with behavioral disturbance: Secondary | ICD-10-CM | POA: Diagnosis not present

## 2018-07-16 DIAGNOSIS — R296 Repeated falls: Secondary | ICD-10-CM | POA: Diagnosis not present

## 2018-07-16 DIAGNOSIS — I10 Essential (primary) hypertension: Secondary | ICD-10-CM | POA: Diagnosis not present

## 2018-07-16 DIAGNOSIS — G309 Alzheimer's disease, unspecified: Secondary | ICD-10-CM | POA: Diagnosis not present

## 2018-07-21 DIAGNOSIS — I10 Essential (primary) hypertension: Secondary | ICD-10-CM | POA: Diagnosis not present

## 2018-07-21 DIAGNOSIS — G309 Alzheimer's disease, unspecified: Secondary | ICD-10-CM | POA: Diagnosis not present

## 2018-07-21 DIAGNOSIS — R296 Repeated falls: Secondary | ICD-10-CM | POA: Diagnosis not present

## 2018-07-21 DIAGNOSIS — F0281 Dementia in other diseases classified elsewhere with behavioral disturbance: Secondary | ICD-10-CM | POA: Diagnosis not present

## 2018-07-21 DIAGNOSIS — E785 Hyperlipidemia, unspecified: Secondary | ICD-10-CM | POA: Diagnosis not present

## 2018-07-21 DIAGNOSIS — I739 Peripheral vascular disease, unspecified: Secondary | ICD-10-CM | POA: Diagnosis not present

## 2018-07-23 DIAGNOSIS — F0280 Dementia in other diseases classified elsewhere without behavioral disturbance: Secondary | ICD-10-CM | POA: Diagnosis not present

## 2018-07-23 DIAGNOSIS — I10 Essential (primary) hypertension: Secondary | ICD-10-CM | POA: Diagnosis not present

## 2018-07-23 DIAGNOSIS — I739 Peripheral vascular disease, unspecified: Secondary | ICD-10-CM | POA: Diagnosis not present

## 2018-07-23 DIAGNOSIS — F0281 Dementia in other diseases classified elsewhere with behavioral disturbance: Secondary | ICD-10-CM | POA: Diagnosis not present

## 2018-07-23 DIAGNOSIS — S32000D Wedge compression fracture of unspecified lumbar vertebra, subsequent encounter for fracture with routine healing: Secondary | ICD-10-CM | POA: Diagnosis not present

## 2018-07-23 DIAGNOSIS — R300 Dysuria: Secondary | ICD-10-CM | POA: Diagnosis not present

## 2018-07-23 DIAGNOSIS — E785 Hyperlipidemia, unspecified: Secondary | ICD-10-CM | POA: Diagnosis not present

## 2018-07-23 DIAGNOSIS — G309 Alzheimer's disease, unspecified: Secondary | ICD-10-CM | POA: Diagnosis not present

## 2018-07-23 DIAGNOSIS — G4701 Insomnia due to medical condition: Secondary | ICD-10-CM | POA: Diagnosis not present

## 2018-07-23 DIAGNOSIS — G301 Alzheimer's disease with late onset: Secondary | ICD-10-CM | POA: Diagnosis not present

## 2018-07-23 DIAGNOSIS — R296 Repeated falls: Secondary | ICD-10-CM | POA: Diagnosis not present

## 2018-07-23 DIAGNOSIS — F064 Anxiety disorder due to known physiological condition: Secondary | ICD-10-CM | POA: Diagnosis not present

## 2018-07-23 DIAGNOSIS — M810 Age-related osteoporosis without current pathological fracture: Secondary | ICD-10-CM | POA: Diagnosis not present

## 2018-07-23 DIAGNOSIS — F331 Major depressive disorder, recurrent, moderate: Secondary | ICD-10-CM | POA: Diagnosis not present

## 2018-07-23 DIAGNOSIS — F418 Other specified anxiety disorders: Secondary | ICD-10-CM | POA: Diagnosis not present

## 2018-07-23 DIAGNOSIS — H409 Unspecified glaucoma: Secondary | ICD-10-CM | POA: Diagnosis not present

## 2018-07-25 DIAGNOSIS — I10 Essential (primary) hypertension: Secondary | ICD-10-CM | POA: Diagnosis not present

## 2018-07-25 DIAGNOSIS — R296 Repeated falls: Secondary | ICD-10-CM | POA: Diagnosis not present

## 2018-07-25 DIAGNOSIS — F0281 Dementia in other diseases classified elsewhere with behavioral disturbance: Secondary | ICD-10-CM | POA: Diagnosis not present

## 2018-07-25 DIAGNOSIS — E785 Hyperlipidemia, unspecified: Secondary | ICD-10-CM | POA: Diagnosis not present

## 2018-07-25 DIAGNOSIS — G309 Alzheimer's disease, unspecified: Secondary | ICD-10-CM | POA: Diagnosis not present

## 2018-07-25 DIAGNOSIS — I739 Peripheral vascular disease, unspecified: Secondary | ICD-10-CM | POA: Diagnosis not present

## 2018-07-28 DIAGNOSIS — I739 Peripheral vascular disease, unspecified: Secondary | ICD-10-CM | POA: Diagnosis not present

## 2018-07-28 DIAGNOSIS — G309 Alzheimer's disease, unspecified: Secondary | ICD-10-CM | POA: Diagnosis not present

## 2018-07-28 DIAGNOSIS — I10 Essential (primary) hypertension: Secondary | ICD-10-CM | POA: Diagnosis not present

## 2018-07-28 DIAGNOSIS — R296 Repeated falls: Secondary | ICD-10-CM | POA: Diagnosis not present

## 2018-07-28 DIAGNOSIS — E785 Hyperlipidemia, unspecified: Secondary | ICD-10-CM | POA: Diagnosis not present

## 2018-07-28 DIAGNOSIS — F0281 Dementia in other diseases classified elsewhere with behavioral disturbance: Secondary | ICD-10-CM | POA: Diagnosis not present

## 2018-07-31 DIAGNOSIS — I739 Peripheral vascular disease, unspecified: Secondary | ICD-10-CM | POA: Diagnosis not present

## 2018-07-31 DIAGNOSIS — F0281 Dementia in other diseases classified elsewhere with behavioral disturbance: Secondary | ICD-10-CM | POA: Diagnosis not present

## 2018-07-31 DIAGNOSIS — G309 Alzheimer's disease, unspecified: Secondary | ICD-10-CM | POA: Diagnosis not present

## 2018-07-31 DIAGNOSIS — E785 Hyperlipidemia, unspecified: Secondary | ICD-10-CM | POA: Diagnosis not present

## 2018-07-31 DIAGNOSIS — R296 Repeated falls: Secondary | ICD-10-CM | POA: Diagnosis not present

## 2018-07-31 DIAGNOSIS — I10 Essential (primary) hypertension: Secondary | ICD-10-CM | POA: Diagnosis not present

## 2018-08-01 DIAGNOSIS — G894 Chronic pain syndrome: Secondary | ICD-10-CM | POA: Diagnosis not present

## 2018-08-01 DIAGNOSIS — D518 Other vitamin B12 deficiency anemias: Secondary | ICD-10-CM | POA: Diagnosis not present

## 2018-08-01 DIAGNOSIS — I10 Essential (primary) hypertension: Secondary | ICD-10-CM | POA: Diagnosis not present

## 2018-08-01 DIAGNOSIS — M1991 Primary osteoarthritis, unspecified site: Secondary | ICD-10-CM | POA: Diagnosis not present

## 2018-08-01 DIAGNOSIS — R296 Repeated falls: Secondary | ICD-10-CM | POA: Diagnosis not present

## 2018-08-01 DIAGNOSIS — H4010X0 Unspecified open-angle glaucoma, stage unspecified: Secondary | ICD-10-CM | POA: Diagnosis not present

## 2018-08-01 DIAGNOSIS — K5900 Constipation, unspecified: Secondary | ICD-10-CM | POA: Diagnosis not present

## 2018-08-04 DIAGNOSIS — R296 Repeated falls: Secondary | ICD-10-CM | POA: Diagnosis not present

## 2018-08-04 DIAGNOSIS — E785 Hyperlipidemia, unspecified: Secondary | ICD-10-CM | POA: Diagnosis not present

## 2018-08-04 DIAGNOSIS — F0281 Dementia in other diseases classified elsewhere with behavioral disturbance: Secondary | ICD-10-CM | POA: Diagnosis not present

## 2018-08-04 DIAGNOSIS — I739 Peripheral vascular disease, unspecified: Secondary | ICD-10-CM | POA: Diagnosis not present

## 2018-08-04 DIAGNOSIS — G309 Alzheimer's disease, unspecified: Secondary | ICD-10-CM | POA: Diagnosis not present

## 2018-08-04 DIAGNOSIS — I10 Essential (primary) hypertension: Secondary | ICD-10-CM | POA: Diagnosis not present

## 2018-08-06 DIAGNOSIS — G4701 Insomnia due to medical condition: Secondary | ICD-10-CM | POA: Diagnosis not present

## 2018-08-06 DIAGNOSIS — F064 Anxiety disorder due to known physiological condition: Secondary | ICD-10-CM | POA: Diagnosis not present

## 2018-08-06 DIAGNOSIS — F331 Major depressive disorder, recurrent, moderate: Secondary | ICD-10-CM | POA: Diagnosis not present

## 2018-08-06 DIAGNOSIS — G301 Alzheimer's disease with late onset: Secondary | ICD-10-CM | POA: Diagnosis not present

## 2018-08-06 DIAGNOSIS — F0280 Dementia in other diseases classified elsewhere without behavioral disturbance: Secondary | ICD-10-CM | POA: Diagnosis not present

## 2018-08-08 DIAGNOSIS — E785 Hyperlipidemia, unspecified: Secondary | ICD-10-CM | POA: Diagnosis not present

## 2018-08-08 DIAGNOSIS — G309 Alzheimer's disease, unspecified: Secondary | ICD-10-CM | POA: Diagnosis not present

## 2018-08-08 DIAGNOSIS — R296 Repeated falls: Secondary | ICD-10-CM | POA: Diagnosis not present

## 2018-08-08 DIAGNOSIS — F0281 Dementia in other diseases classified elsewhere with behavioral disturbance: Secondary | ICD-10-CM | POA: Diagnosis not present

## 2018-08-08 DIAGNOSIS — I10 Essential (primary) hypertension: Secondary | ICD-10-CM | POA: Diagnosis not present

## 2018-08-08 DIAGNOSIS — I739 Peripheral vascular disease, unspecified: Secondary | ICD-10-CM | POA: Diagnosis not present

## 2018-08-08 DIAGNOSIS — G894 Chronic pain syndrome: Secondary | ICD-10-CM | POA: Diagnosis not present

## 2018-08-11 DIAGNOSIS — F0281 Dementia in other diseases classified elsewhere with behavioral disturbance: Secondary | ICD-10-CM | POA: Diagnosis not present

## 2018-08-11 DIAGNOSIS — E785 Hyperlipidemia, unspecified: Secondary | ICD-10-CM | POA: Diagnosis not present

## 2018-08-11 DIAGNOSIS — I10 Essential (primary) hypertension: Secondary | ICD-10-CM | POA: Diagnosis not present

## 2018-08-11 DIAGNOSIS — G309 Alzheimer's disease, unspecified: Secondary | ICD-10-CM | POA: Diagnosis not present

## 2018-08-11 DIAGNOSIS — I739 Peripheral vascular disease, unspecified: Secondary | ICD-10-CM | POA: Diagnosis not present

## 2018-08-11 DIAGNOSIS — R296 Repeated falls: Secondary | ICD-10-CM | POA: Diagnosis not present

## 2018-08-15 DIAGNOSIS — E785 Hyperlipidemia, unspecified: Secondary | ICD-10-CM | POA: Diagnosis not present

## 2018-08-15 DIAGNOSIS — R296 Repeated falls: Secondary | ICD-10-CM | POA: Diagnosis not present

## 2018-08-15 DIAGNOSIS — I739 Peripheral vascular disease, unspecified: Secondary | ICD-10-CM | POA: Diagnosis not present

## 2018-08-15 DIAGNOSIS — F0281 Dementia in other diseases classified elsewhere with behavioral disturbance: Secondary | ICD-10-CM | POA: Diagnosis not present

## 2018-08-15 DIAGNOSIS — I10 Essential (primary) hypertension: Secondary | ICD-10-CM | POA: Diagnosis not present

## 2018-08-15 DIAGNOSIS — G309 Alzheimer's disease, unspecified: Secondary | ICD-10-CM | POA: Diagnosis not present

## 2018-08-20 DIAGNOSIS — E785 Hyperlipidemia, unspecified: Secondary | ICD-10-CM | POA: Diagnosis not present

## 2018-08-20 DIAGNOSIS — G301 Alzheimer's disease with late onset: Secondary | ICD-10-CM | POA: Diagnosis not present

## 2018-08-20 DIAGNOSIS — R296 Repeated falls: Secondary | ICD-10-CM | POA: Diagnosis not present

## 2018-08-20 DIAGNOSIS — F0280 Dementia in other diseases classified elsewhere without behavioral disturbance: Secondary | ICD-10-CM | POA: Diagnosis not present

## 2018-08-20 DIAGNOSIS — I739 Peripheral vascular disease, unspecified: Secondary | ICD-10-CM | POA: Diagnosis not present

## 2018-08-20 DIAGNOSIS — F331 Major depressive disorder, recurrent, moderate: Secondary | ICD-10-CM | POA: Diagnosis not present

## 2018-08-20 DIAGNOSIS — G309 Alzheimer's disease, unspecified: Secondary | ICD-10-CM | POA: Diagnosis not present

## 2018-08-20 DIAGNOSIS — G4701 Insomnia due to medical condition: Secondary | ICD-10-CM | POA: Diagnosis not present

## 2018-08-20 DIAGNOSIS — I10 Essential (primary) hypertension: Secondary | ICD-10-CM | POA: Diagnosis not present

## 2018-08-20 DIAGNOSIS — F064 Anxiety disorder due to known physiological condition: Secondary | ICD-10-CM | POA: Diagnosis not present

## 2018-08-20 DIAGNOSIS — F0281 Dementia in other diseases classified elsewhere with behavioral disturbance: Secondary | ICD-10-CM | POA: Diagnosis not present

## 2018-08-22 DIAGNOSIS — I10 Essential (primary) hypertension: Secondary | ICD-10-CM | POA: Diagnosis not present

## 2018-08-22 DIAGNOSIS — F0281 Dementia in other diseases classified elsewhere with behavioral disturbance: Secondary | ICD-10-CM | POA: Diagnosis not present

## 2018-08-22 DIAGNOSIS — R296 Repeated falls: Secondary | ICD-10-CM | POA: Diagnosis not present

## 2018-08-22 DIAGNOSIS — I739 Peripheral vascular disease, unspecified: Secondary | ICD-10-CM | POA: Diagnosis not present

## 2018-08-22 DIAGNOSIS — H409 Unspecified glaucoma: Secondary | ICD-10-CM | POA: Diagnosis not present

## 2018-08-22 DIAGNOSIS — M810 Age-related osteoporosis without current pathological fracture: Secondary | ICD-10-CM | POA: Diagnosis not present

## 2018-08-22 DIAGNOSIS — G309 Alzheimer's disease, unspecified: Secondary | ICD-10-CM | POA: Diagnosis not present

## 2018-08-22 DIAGNOSIS — S32000D Wedge compression fracture of unspecified lumbar vertebra, subsequent encounter for fracture with routine healing: Secondary | ICD-10-CM | POA: Diagnosis not present

## 2018-08-22 DIAGNOSIS — F418 Other specified anxiety disorders: Secondary | ICD-10-CM | POA: Diagnosis not present

## 2018-08-22 DIAGNOSIS — E785 Hyperlipidemia, unspecified: Secondary | ICD-10-CM | POA: Diagnosis not present

## 2018-08-25 DIAGNOSIS — F0281 Dementia in other diseases classified elsewhere with behavioral disturbance: Secondary | ICD-10-CM | POA: Diagnosis not present

## 2018-08-25 DIAGNOSIS — G309 Alzheimer's disease, unspecified: Secondary | ICD-10-CM | POA: Diagnosis not present

## 2018-08-25 DIAGNOSIS — E785 Hyperlipidemia, unspecified: Secondary | ICD-10-CM | POA: Diagnosis not present

## 2018-08-25 DIAGNOSIS — I10 Essential (primary) hypertension: Secondary | ICD-10-CM | POA: Diagnosis not present

## 2018-08-25 DIAGNOSIS — R296 Repeated falls: Secondary | ICD-10-CM | POA: Diagnosis not present

## 2018-08-25 DIAGNOSIS — I739 Peripheral vascular disease, unspecified: Secondary | ICD-10-CM | POA: Diagnosis not present

## 2018-08-26 DIAGNOSIS — E785 Hyperlipidemia, unspecified: Secondary | ICD-10-CM | POA: Diagnosis not present

## 2018-08-26 DIAGNOSIS — I739 Peripheral vascular disease, unspecified: Secondary | ICD-10-CM | POA: Diagnosis not present

## 2018-08-26 DIAGNOSIS — I10 Essential (primary) hypertension: Secondary | ICD-10-CM | POA: Diagnosis not present

## 2018-08-26 DIAGNOSIS — G309 Alzheimer's disease, unspecified: Secondary | ICD-10-CM | POA: Diagnosis not present

## 2018-08-26 DIAGNOSIS — F0281 Dementia in other diseases classified elsewhere with behavioral disturbance: Secondary | ICD-10-CM | POA: Diagnosis not present

## 2018-08-26 DIAGNOSIS — R296 Repeated falls: Secondary | ICD-10-CM | POA: Diagnosis not present

## 2018-08-29 DIAGNOSIS — R296 Repeated falls: Secondary | ICD-10-CM | POA: Diagnosis not present

## 2018-08-29 DIAGNOSIS — I739 Peripheral vascular disease, unspecified: Secondary | ICD-10-CM | POA: Diagnosis not present

## 2018-08-29 DIAGNOSIS — D518 Other vitamin B12 deficiency anemias: Secondary | ICD-10-CM | POA: Diagnosis not present

## 2018-08-29 DIAGNOSIS — I10 Essential (primary) hypertension: Secondary | ICD-10-CM | POA: Diagnosis not present

## 2018-08-29 DIAGNOSIS — G894 Chronic pain syndrome: Secondary | ICD-10-CM | POA: Diagnosis not present

## 2018-08-29 DIAGNOSIS — H4010X0 Unspecified open-angle glaucoma, stage unspecified: Secondary | ICD-10-CM | POA: Diagnosis not present

## 2018-08-29 DIAGNOSIS — F0281 Dementia in other diseases classified elsewhere with behavioral disturbance: Secondary | ICD-10-CM | POA: Diagnosis not present

## 2018-08-29 DIAGNOSIS — E785 Hyperlipidemia, unspecified: Secondary | ICD-10-CM | POA: Diagnosis not present

## 2018-08-29 DIAGNOSIS — M1991 Primary osteoarthritis, unspecified site: Secondary | ICD-10-CM | POA: Diagnosis not present

## 2018-08-29 DIAGNOSIS — K5900 Constipation, unspecified: Secondary | ICD-10-CM | POA: Diagnosis not present

## 2018-08-29 DIAGNOSIS — G309 Alzheimer's disease, unspecified: Secondary | ICD-10-CM | POA: Diagnosis not present

## 2018-09-01 DIAGNOSIS — G309 Alzheimer's disease, unspecified: Secondary | ICD-10-CM | POA: Diagnosis not present

## 2018-09-01 DIAGNOSIS — R296 Repeated falls: Secondary | ICD-10-CM | POA: Diagnosis not present

## 2018-09-01 DIAGNOSIS — I10 Essential (primary) hypertension: Secondary | ICD-10-CM | POA: Diagnosis not present

## 2018-09-01 DIAGNOSIS — F0281 Dementia in other diseases classified elsewhere with behavioral disturbance: Secondary | ICD-10-CM | POA: Diagnosis not present

## 2018-09-01 DIAGNOSIS — E785 Hyperlipidemia, unspecified: Secondary | ICD-10-CM | POA: Diagnosis not present

## 2018-09-01 DIAGNOSIS — I739 Peripheral vascular disease, unspecified: Secondary | ICD-10-CM | POA: Diagnosis not present

## 2018-09-03 DIAGNOSIS — F064 Anxiety disorder due to known physiological condition: Secondary | ICD-10-CM | POA: Diagnosis not present

## 2018-09-03 DIAGNOSIS — F0280 Dementia in other diseases classified elsewhere without behavioral disturbance: Secondary | ICD-10-CM | POA: Diagnosis not present

## 2018-09-03 DIAGNOSIS — G4701 Insomnia due to medical condition: Secondary | ICD-10-CM | POA: Diagnosis not present

## 2018-09-03 DIAGNOSIS — F331 Major depressive disorder, recurrent, moderate: Secondary | ICD-10-CM | POA: Diagnosis not present

## 2018-09-03 DIAGNOSIS — G301 Alzheimer's disease with late onset: Secondary | ICD-10-CM | POA: Diagnosis not present

## 2018-09-05 DIAGNOSIS — R296 Repeated falls: Secondary | ICD-10-CM | POA: Diagnosis not present

## 2018-09-05 DIAGNOSIS — F0281 Dementia in other diseases classified elsewhere with behavioral disturbance: Secondary | ICD-10-CM | POA: Diagnosis not present

## 2018-09-05 DIAGNOSIS — I739 Peripheral vascular disease, unspecified: Secondary | ICD-10-CM | POA: Diagnosis not present

## 2018-09-05 DIAGNOSIS — E785 Hyperlipidemia, unspecified: Secondary | ICD-10-CM | POA: Diagnosis not present

## 2018-09-05 DIAGNOSIS — G894 Chronic pain syndrome: Secondary | ICD-10-CM | POA: Diagnosis not present

## 2018-09-05 DIAGNOSIS — I10 Essential (primary) hypertension: Secondary | ICD-10-CM | POA: Diagnosis not present

## 2018-09-05 DIAGNOSIS — G309 Alzheimer's disease, unspecified: Secondary | ICD-10-CM | POA: Diagnosis not present

## 2018-09-08 DIAGNOSIS — I10 Essential (primary) hypertension: Secondary | ICD-10-CM | POA: Diagnosis not present

## 2018-09-08 DIAGNOSIS — I739 Peripheral vascular disease, unspecified: Secondary | ICD-10-CM | POA: Diagnosis not present

## 2018-09-08 DIAGNOSIS — G309 Alzheimer's disease, unspecified: Secondary | ICD-10-CM | POA: Diagnosis not present

## 2018-09-08 DIAGNOSIS — F0281 Dementia in other diseases classified elsewhere with behavioral disturbance: Secondary | ICD-10-CM | POA: Diagnosis not present

## 2018-09-08 DIAGNOSIS — R296 Repeated falls: Secondary | ICD-10-CM | POA: Diagnosis not present

## 2018-09-08 DIAGNOSIS — E785 Hyperlipidemia, unspecified: Secondary | ICD-10-CM | POA: Diagnosis not present

## 2018-09-09 DIAGNOSIS — F0281 Dementia in other diseases classified elsewhere with behavioral disturbance: Secondary | ICD-10-CM | POA: Diagnosis not present

## 2018-09-09 DIAGNOSIS — I739 Peripheral vascular disease, unspecified: Secondary | ICD-10-CM | POA: Diagnosis not present

## 2018-09-09 DIAGNOSIS — G309 Alzheimer's disease, unspecified: Secondary | ICD-10-CM | POA: Diagnosis not present

## 2018-09-09 DIAGNOSIS — R296 Repeated falls: Secondary | ICD-10-CM | POA: Diagnosis not present

## 2018-09-09 DIAGNOSIS — I10 Essential (primary) hypertension: Secondary | ICD-10-CM | POA: Diagnosis not present

## 2018-09-09 DIAGNOSIS — E785 Hyperlipidemia, unspecified: Secondary | ICD-10-CM | POA: Diagnosis not present

## 2018-09-10 DIAGNOSIS — I10 Essential (primary) hypertension: Secondary | ICD-10-CM | POA: Diagnosis not present

## 2018-09-10 DIAGNOSIS — R296 Repeated falls: Secondary | ICD-10-CM | POA: Diagnosis not present

## 2018-09-10 DIAGNOSIS — E785 Hyperlipidemia, unspecified: Secondary | ICD-10-CM | POA: Diagnosis not present

## 2018-09-10 DIAGNOSIS — I739 Peripheral vascular disease, unspecified: Secondary | ICD-10-CM | POA: Diagnosis not present

## 2018-09-10 DIAGNOSIS — G309 Alzheimer's disease, unspecified: Secondary | ICD-10-CM | POA: Diagnosis not present

## 2018-09-10 DIAGNOSIS — F0281 Dementia in other diseases classified elsewhere with behavioral disturbance: Secondary | ICD-10-CM | POA: Diagnosis not present

## 2018-09-11 DIAGNOSIS — I10 Essential (primary) hypertension: Secondary | ICD-10-CM | POA: Diagnosis not present

## 2018-09-11 DIAGNOSIS — E785 Hyperlipidemia, unspecified: Secondary | ICD-10-CM | POA: Diagnosis not present

## 2018-09-11 DIAGNOSIS — I739 Peripheral vascular disease, unspecified: Secondary | ICD-10-CM | POA: Diagnosis not present

## 2018-09-11 DIAGNOSIS — G309 Alzheimer's disease, unspecified: Secondary | ICD-10-CM | POA: Diagnosis not present

## 2018-09-11 DIAGNOSIS — F0281 Dementia in other diseases classified elsewhere with behavioral disturbance: Secondary | ICD-10-CM | POA: Diagnosis not present

## 2018-09-11 DIAGNOSIS — R296 Repeated falls: Secondary | ICD-10-CM | POA: Diagnosis not present

## 2018-09-16 DIAGNOSIS — G309 Alzheimer's disease, unspecified: Secondary | ICD-10-CM | POA: Diagnosis not present

## 2018-09-16 DIAGNOSIS — F0281 Dementia in other diseases classified elsewhere with behavioral disturbance: Secondary | ICD-10-CM | POA: Diagnosis not present

## 2018-09-16 DIAGNOSIS — I10 Essential (primary) hypertension: Secondary | ICD-10-CM | POA: Diagnosis not present

## 2018-09-16 DIAGNOSIS — R296 Repeated falls: Secondary | ICD-10-CM | POA: Diagnosis not present

## 2018-09-16 DIAGNOSIS — E785 Hyperlipidemia, unspecified: Secondary | ICD-10-CM | POA: Diagnosis not present

## 2018-09-16 DIAGNOSIS — I739 Peripheral vascular disease, unspecified: Secondary | ICD-10-CM | POA: Diagnosis not present

## 2018-09-17 DIAGNOSIS — F419 Anxiety disorder, unspecified: Secondary | ICD-10-CM | POA: Diagnosis not present

## 2018-09-19 DIAGNOSIS — G309 Alzheimer's disease, unspecified: Secondary | ICD-10-CM | POA: Diagnosis not present

## 2018-09-19 DIAGNOSIS — I10 Essential (primary) hypertension: Secondary | ICD-10-CM | POA: Diagnosis not present

## 2018-09-19 DIAGNOSIS — I739 Peripheral vascular disease, unspecified: Secondary | ICD-10-CM | POA: Diagnosis not present

## 2018-09-19 DIAGNOSIS — E785 Hyperlipidemia, unspecified: Secondary | ICD-10-CM | POA: Diagnosis not present

## 2018-09-19 DIAGNOSIS — R296 Repeated falls: Secondary | ICD-10-CM | POA: Diagnosis not present

## 2018-09-19 DIAGNOSIS — F0281 Dementia in other diseases classified elsewhere with behavioral disturbance: Secondary | ICD-10-CM | POA: Diagnosis not present

## 2018-09-22 DIAGNOSIS — F418 Other specified anxiety disorders: Secondary | ICD-10-CM | POA: Diagnosis not present

## 2018-09-22 DIAGNOSIS — G309 Alzheimer's disease, unspecified: Secondary | ICD-10-CM | POA: Diagnosis not present

## 2018-09-22 DIAGNOSIS — R296 Repeated falls: Secondary | ICD-10-CM | POA: Diagnosis not present

## 2018-09-22 DIAGNOSIS — F0281 Dementia in other diseases classified elsewhere with behavioral disturbance: Secondary | ICD-10-CM | POA: Diagnosis not present

## 2018-09-22 DIAGNOSIS — S32000D Wedge compression fracture of unspecified lumbar vertebra, subsequent encounter for fracture with routine healing: Secondary | ICD-10-CM | POA: Diagnosis not present

## 2018-09-22 DIAGNOSIS — I739 Peripheral vascular disease, unspecified: Secondary | ICD-10-CM | POA: Diagnosis not present

## 2018-09-22 DIAGNOSIS — H409 Unspecified glaucoma: Secondary | ICD-10-CM | POA: Diagnosis not present

## 2018-09-22 DIAGNOSIS — I10 Essential (primary) hypertension: Secondary | ICD-10-CM | POA: Diagnosis not present

## 2018-09-22 DIAGNOSIS — M810 Age-related osteoporosis without current pathological fracture: Secondary | ICD-10-CM | POA: Diagnosis not present

## 2018-09-22 DIAGNOSIS — E785 Hyperlipidemia, unspecified: Secondary | ICD-10-CM | POA: Diagnosis not present

## 2018-09-23 DIAGNOSIS — I739 Peripheral vascular disease, unspecified: Secondary | ICD-10-CM | POA: Diagnosis not present

## 2018-09-23 DIAGNOSIS — F0281 Dementia in other diseases classified elsewhere with behavioral disturbance: Secondary | ICD-10-CM | POA: Diagnosis not present

## 2018-09-23 DIAGNOSIS — E785 Hyperlipidemia, unspecified: Secondary | ICD-10-CM | POA: Diagnosis not present

## 2018-09-23 DIAGNOSIS — I10 Essential (primary) hypertension: Secondary | ICD-10-CM | POA: Diagnosis not present

## 2018-09-23 DIAGNOSIS — R296 Repeated falls: Secondary | ICD-10-CM | POA: Diagnosis not present

## 2018-09-23 DIAGNOSIS — G309 Alzheimer's disease, unspecified: Secondary | ICD-10-CM | POA: Diagnosis not present

## 2018-09-26 DIAGNOSIS — R296 Repeated falls: Secondary | ICD-10-CM | POA: Diagnosis not present

## 2018-09-26 DIAGNOSIS — I10 Essential (primary) hypertension: Secondary | ICD-10-CM | POA: Diagnosis not present

## 2018-09-26 DIAGNOSIS — G309 Alzheimer's disease, unspecified: Secondary | ICD-10-CM | POA: Diagnosis not present

## 2018-09-26 DIAGNOSIS — F0281 Dementia in other diseases classified elsewhere with behavioral disturbance: Secondary | ICD-10-CM | POA: Diagnosis not present

## 2018-09-26 DIAGNOSIS — E785 Hyperlipidemia, unspecified: Secondary | ICD-10-CM | POA: Diagnosis not present

## 2018-09-26 DIAGNOSIS — I739 Peripheral vascular disease, unspecified: Secondary | ICD-10-CM | POA: Diagnosis not present

## 2018-09-30 DIAGNOSIS — R296 Repeated falls: Secondary | ICD-10-CM | POA: Diagnosis not present

## 2018-09-30 DIAGNOSIS — I739 Peripheral vascular disease, unspecified: Secondary | ICD-10-CM | POA: Diagnosis not present

## 2018-09-30 DIAGNOSIS — I10 Essential (primary) hypertension: Secondary | ICD-10-CM | POA: Diagnosis not present

## 2018-09-30 DIAGNOSIS — G309 Alzheimer's disease, unspecified: Secondary | ICD-10-CM | POA: Diagnosis not present

## 2018-09-30 DIAGNOSIS — E785 Hyperlipidemia, unspecified: Secondary | ICD-10-CM | POA: Diagnosis not present

## 2018-09-30 DIAGNOSIS — F0281 Dementia in other diseases classified elsewhere with behavioral disturbance: Secondary | ICD-10-CM | POA: Diagnosis not present

## 2018-10-01 DIAGNOSIS — F064 Anxiety disorder due to known physiological condition: Secondary | ICD-10-CM | POA: Diagnosis not present

## 2018-10-01 DIAGNOSIS — G301 Alzheimer's disease with late onset: Secondary | ICD-10-CM | POA: Diagnosis not present

## 2018-10-01 DIAGNOSIS — R296 Repeated falls: Secondary | ICD-10-CM | POA: Diagnosis not present

## 2018-10-01 DIAGNOSIS — E785 Hyperlipidemia, unspecified: Secondary | ICD-10-CM | POA: Diagnosis not present

## 2018-10-01 DIAGNOSIS — I739 Peripheral vascular disease, unspecified: Secondary | ICD-10-CM | POA: Diagnosis not present

## 2018-10-01 DIAGNOSIS — I10 Essential (primary) hypertension: Secondary | ICD-10-CM | POA: Diagnosis not present

## 2018-10-01 DIAGNOSIS — G4701 Insomnia due to medical condition: Secondary | ICD-10-CM | POA: Diagnosis not present

## 2018-10-01 DIAGNOSIS — F0281 Dementia in other diseases classified elsewhere with behavioral disturbance: Secondary | ICD-10-CM | POA: Diagnosis not present

## 2018-10-01 DIAGNOSIS — F0280 Dementia in other diseases classified elsewhere without behavioral disturbance: Secondary | ICD-10-CM | POA: Diagnosis not present

## 2018-10-01 DIAGNOSIS — F331 Major depressive disorder, recurrent, moderate: Secondary | ICD-10-CM | POA: Diagnosis not present

## 2018-10-01 DIAGNOSIS — G309 Alzheimer's disease, unspecified: Secondary | ICD-10-CM | POA: Diagnosis not present

## 2018-10-03 DIAGNOSIS — G894 Chronic pain syndrome: Secondary | ICD-10-CM | POA: Diagnosis not present

## 2018-10-06 DIAGNOSIS — R296 Repeated falls: Secondary | ICD-10-CM | POA: Diagnosis not present

## 2018-10-06 DIAGNOSIS — I739 Peripheral vascular disease, unspecified: Secondary | ICD-10-CM | POA: Diagnosis not present

## 2018-10-06 DIAGNOSIS — E785 Hyperlipidemia, unspecified: Secondary | ICD-10-CM | POA: Diagnosis not present

## 2018-10-06 DIAGNOSIS — G309 Alzheimer's disease, unspecified: Secondary | ICD-10-CM | POA: Diagnosis not present

## 2018-10-06 DIAGNOSIS — I10 Essential (primary) hypertension: Secondary | ICD-10-CM | POA: Diagnosis not present

## 2018-10-06 DIAGNOSIS — F0281 Dementia in other diseases classified elsewhere with behavioral disturbance: Secondary | ICD-10-CM | POA: Diagnosis not present

## 2018-10-07 DIAGNOSIS — F0281 Dementia in other diseases classified elsewhere with behavioral disturbance: Secondary | ICD-10-CM | POA: Diagnosis not present

## 2018-10-07 DIAGNOSIS — G309 Alzheimer's disease, unspecified: Secondary | ICD-10-CM | POA: Diagnosis not present

## 2018-10-07 DIAGNOSIS — I10 Essential (primary) hypertension: Secondary | ICD-10-CM | POA: Diagnosis not present

## 2018-10-07 DIAGNOSIS — I739 Peripheral vascular disease, unspecified: Secondary | ICD-10-CM | POA: Diagnosis not present

## 2018-10-07 DIAGNOSIS — R296 Repeated falls: Secondary | ICD-10-CM | POA: Diagnosis not present

## 2018-10-07 DIAGNOSIS — E785 Hyperlipidemia, unspecified: Secondary | ICD-10-CM | POA: Diagnosis not present

## 2018-10-10 DIAGNOSIS — G894 Chronic pain syndrome: Secondary | ICD-10-CM | POA: Diagnosis not present

## 2018-10-10 DIAGNOSIS — H4010X0 Unspecified open-angle glaucoma, stage unspecified: Secondary | ICD-10-CM | POA: Diagnosis not present

## 2018-10-10 DIAGNOSIS — I10 Essential (primary) hypertension: Secondary | ICD-10-CM | POA: Diagnosis not present

## 2018-10-10 DIAGNOSIS — K5900 Constipation, unspecified: Secondary | ICD-10-CM | POA: Diagnosis not present

## 2018-10-10 DIAGNOSIS — M1991 Primary osteoarthritis, unspecified site: Secondary | ICD-10-CM | POA: Diagnosis not present

## 2018-10-10 DIAGNOSIS — R296 Repeated falls: Secondary | ICD-10-CM | POA: Diagnosis not present

## 2018-10-10 DIAGNOSIS — D518 Other vitamin B12 deficiency anemias: Secondary | ICD-10-CM | POA: Diagnosis not present

## 2018-10-13 DIAGNOSIS — I739 Peripheral vascular disease, unspecified: Secondary | ICD-10-CM | POA: Diagnosis not present

## 2018-10-13 DIAGNOSIS — E785 Hyperlipidemia, unspecified: Secondary | ICD-10-CM | POA: Diagnosis not present

## 2018-10-13 DIAGNOSIS — G309 Alzheimer's disease, unspecified: Secondary | ICD-10-CM | POA: Diagnosis not present

## 2018-10-13 DIAGNOSIS — F0281 Dementia in other diseases classified elsewhere with behavioral disturbance: Secondary | ICD-10-CM | POA: Diagnosis not present

## 2018-10-13 DIAGNOSIS — R296 Repeated falls: Secondary | ICD-10-CM | POA: Diagnosis not present

## 2018-10-13 DIAGNOSIS — I10 Essential (primary) hypertension: Secondary | ICD-10-CM | POA: Diagnosis not present

## 2018-10-14 DIAGNOSIS — F0281 Dementia in other diseases classified elsewhere with behavioral disturbance: Secondary | ICD-10-CM | POA: Diagnosis not present

## 2018-10-14 DIAGNOSIS — E785 Hyperlipidemia, unspecified: Secondary | ICD-10-CM | POA: Diagnosis not present

## 2018-10-14 DIAGNOSIS — I739 Peripheral vascular disease, unspecified: Secondary | ICD-10-CM | POA: Diagnosis not present

## 2018-10-14 DIAGNOSIS — R296 Repeated falls: Secondary | ICD-10-CM | POA: Diagnosis not present

## 2018-10-14 DIAGNOSIS — I10 Essential (primary) hypertension: Secondary | ICD-10-CM | POA: Diagnosis not present

## 2018-10-14 DIAGNOSIS — G309 Alzheimer's disease, unspecified: Secondary | ICD-10-CM | POA: Diagnosis not present

## 2018-10-17 DIAGNOSIS — I10 Essential (primary) hypertension: Secondary | ICD-10-CM | POA: Diagnosis not present

## 2018-10-17 DIAGNOSIS — R296 Repeated falls: Secondary | ICD-10-CM | POA: Diagnosis not present

## 2018-10-17 DIAGNOSIS — F0280 Dementia in other diseases classified elsewhere without behavioral disturbance: Secondary | ICD-10-CM | POA: Diagnosis not present

## 2018-10-17 DIAGNOSIS — I739 Peripheral vascular disease, unspecified: Secondary | ICD-10-CM | POA: Diagnosis not present

## 2018-10-17 DIAGNOSIS — E785 Hyperlipidemia, unspecified: Secondary | ICD-10-CM | POA: Diagnosis not present

## 2018-10-17 DIAGNOSIS — G301 Alzheimer's disease with late onset: Secondary | ICD-10-CM | POA: Diagnosis not present

## 2018-10-17 DIAGNOSIS — F064 Anxiety disorder due to known physiological condition: Secondary | ICD-10-CM | POA: Diagnosis not present

## 2018-10-17 DIAGNOSIS — F331 Major depressive disorder, recurrent, moderate: Secondary | ICD-10-CM | POA: Diagnosis not present

## 2018-10-17 DIAGNOSIS — F0281 Dementia in other diseases classified elsewhere with behavioral disturbance: Secondary | ICD-10-CM | POA: Diagnosis not present

## 2018-10-17 DIAGNOSIS — G4701 Insomnia due to medical condition: Secondary | ICD-10-CM | POA: Diagnosis not present

## 2018-10-17 DIAGNOSIS — G309 Alzheimer's disease, unspecified: Secondary | ICD-10-CM | POA: Diagnosis not present

## 2018-10-20 DIAGNOSIS — G309 Alzheimer's disease, unspecified: Secondary | ICD-10-CM | POA: Diagnosis not present

## 2018-10-20 DIAGNOSIS — F0281 Dementia in other diseases classified elsewhere with behavioral disturbance: Secondary | ICD-10-CM | POA: Diagnosis not present

## 2018-10-20 DIAGNOSIS — I10 Essential (primary) hypertension: Secondary | ICD-10-CM | POA: Diagnosis not present

## 2018-10-20 DIAGNOSIS — I739 Peripheral vascular disease, unspecified: Secondary | ICD-10-CM | POA: Diagnosis not present

## 2018-10-20 DIAGNOSIS — E785 Hyperlipidemia, unspecified: Secondary | ICD-10-CM | POA: Diagnosis not present

## 2018-10-20 DIAGNOSIS — R296 Repeated falls: Secondary | ICD-10-CM | POA: Diagnosis not present

## 2018-10-21 DIAGNOSIS — F0281 Dementia in other diseases classified elsewhere with behavioral disturbance: Secondary | ICD-10-CM | POA: Diagnosis not present

## 2018-10-21 DIAGNOSIS — G309 Alzheimer's disease, unspecified: Secondary | ICD-10-CM | POA: Diagnosis not present

## 2018-10-21 DIAGNOSIS — I739 Peripheral vascular disease, unspecified: Secondary | ICD-10-CM | POA: Diagnosis not present

## 2018-10-21 DIAGNOSIS — R296 Repeated falls: Secondary | ICD-10-CM | POA: Diagnosis not present

## 2018-10-21 DIAGNOSIS — E785 Hyperlipidemia, unspecified: Secondary | ICD-10-CM | POA: Diagnosis not present

## 2018-10-21 DIAGNOSIS — I10 Essential (primary) hypertension: Secondary | ICD-10-CM | POA: Diagnosis not present

## 2018-10-22 DIAGNOSIS — G309 Alzheimer's disease, unspecified: Secondary | ICD-10-CM | POA: Diagnosis not present

## 2018-10-22 DIAGNOSIS — M810 Age-related osteoporosis without current pathological fracture: Secondary | ICD-10-CM | POA: Diagnosis not present

## 2018-10-22 DIAGNOSIS — I10 Essential (primary) hypertension: Secondary | ICD-10-CM | POA: Diagnosis not present

## 2018-10-22 DIAGNOSIS — S32000D Wedge compression fracture of unspecified lumbar vertebra, subsequent encounter for fracture with routine healing: Secondary | ICD-10-CM | POA: Diagnosis not present

## 2018-10-22 DIAGNOSIS — K5900 Constipation, unspecified: Secondary | ICD-10-CM | POA: Diagnosis not present

## 2018-10-22 DIAGNOSIS — F418 Other specified anxiety disorders: Secondary | ICD-10-CM | POA: Diagnosis not present

## 2018-10-22 DIAGNOSIS — H409 Unspecified glaucoma: Secondary | ICD-10-CM | POA: Diagnosis not present

## 2018-10-22 DIAGNOSIS — I739 Peripheral vascular disease, unspecified: Secondary | ICD-10-CM | POA: Diagnosis not present

## 2018-10-22 DIAGNOSIS — Z681 Body mass index (BMI) 19 or less, adult: Secondary | ICD-10-CM | POA: Diagnosis not present

## 2018-10-22 DIAGNOSIS — E785 Hyperlipidemia, unspecified: Secondary | ICD-10-CM | POA: Diagnosis not present

## 2018-10-22 DIAGNOSIS — S81811D Laceration without foreign body, right lower leg, subsequent encounter: Secondary | ICD-10-CM | POA: Diagnosis not present

## 2018-10-22 DIAGNOSIS — R296 Repeated falls: Secondary | ICD-10-CM | POA: Diagnosis not present

## 2018-10-22 DIAGNOSIS — F0281 Dementia in other diseases classified elsewhere with behavioral disturbance: Secondary | ICD-10-CM | POA: Diagnosis not present

## 2018-10-24 DIAGNOSIS — I739 Peripheral vascular disease, unspecified: Secondary | ICD-10-CM | POA: Diagnosis not present

## 2018-10-24 DIAGNOSIS — G309 Alzheimer's disease, unspecified: Secondary | ICD-10-CM | POA: Diagnosis not present

## 2018-10-24 DIAGNOSIS — I10 Essential (primary) hypertension: Secondary | ICD-10-CM | POA: Diagnosis not present

## 2018-10-24 DIAGNOSIS — R296 Repeated falls: Secondary | ICD-10-CM | POA: Diagnosis not present

## 2018-10-24 DIAGNOSIS — E785 Hyperlipidemia, unspecified: Secondary | ICD-10-CM | POA: Diagnosis not present

## 2018-10-24 DIAGNOSIS — F0281 Dementia in other diseases classified elsewhere with behavioral disturbance: Secondary | ICD-10-CM | POA: Diagnosis not present

## 2018-10-28 DIAGNOSIS — G309 Alzheimer's disease, unspecified: Secondary | ICD-10-CM | POA: Diagnosis not present

## 2018-10-28 DIAGNOSIS — I10 Essential (primary) hypertension: Secondary | ICD-10-CM | POA: Diagnosis not present

## 2018-10-28 DIAGNOSIS — F0281 Dementia in other diseases classified elsewhere with behavioral disturbance: Secondary | ICD-10-CM | POA: Diagnosis not present

## 2018-10-28 DIAGNOSIS — R296 Repeated falls: Secondary | ICD-10-CM | POA: Diagnosis not present

## 2018-10-28 DIAGNOSIS — I739 Peripheral vascular disease, unspecified: Secondary | ICD-10-CM | POA: Diagnosis not present

## 2018-10-28 DIAGNOSIS — E785 Hyperlipidemia, unspecified: Secondary | ICD-10-CM | POA: Diagnosis not present

## 2018-10-29 DIAGNOSIS — G4701 Insomnia due to medical condition: Secondary | ICD-10-CM | POA: Diagnosis not present

## 2018-10-29 DIAGNOSIS — F0280 Dementia in other diseases classified elsewhere without behavioral disturbance: Secondary | ICD-10-CM | POA: Diagnosis not present

## 2018-10-29 DIAGNOSIS — F331 Major depressive disorder, recurrent, moderate: Secondary | ICD-10-CM | POA: Diagnosis not present

## 2018-10-29 DIAGNOSIS — G301 Alzheimer's disease with late onset: Secondary | ICD-10-CM | POA: Diagnosis not present

## 2018-10-29 DIAGNOSIS — F064 Anxiety disorder due to known physiological condition: Secondary | ICD-10-CM | POA: Diagnosis not present

## 2018-10-30 DIAGNOSIS — R296 Repeated falls: Secondary | ICD-10-CM | POA: Diagnosis not present

## 2018-10-30 DIAGNOSIS — I739 Peripheral vascular disease, unspecified: Secondary | ICD-10-CM | POA: Diagnosis not present

## 2018-10-30 DIAGNOSIS — E785 Hyperlipidemia, unspecified: Secondary | ICD-10-CM | POA: Diagnosis not present

## 2018-10-30 DIAGNOSIS — G309 Alzheimer's disease, unspecified: Secondary | ICD-10-CM | POA: Diagnosis not present

## 2018-10-30 DIAGNOSIS — I10 Essential (primary) hypertension: Secondary | ICD-10-CM | POA: Diagnosis not present

## 2018-10-30 DIAGNOSIS — F0281 Dementia in other diseases classified elsewhere with behavioral disturbance: Secondary | ICD-10-CM | POA: Diagnosis not present

## 2018-10-31 DIAGNOSIS — G894 Chronic pain syndrome: Secondary | ICD-10-CM | POA: Diagnosis not present

## 2018-11-02 DIAGNOSIS — F0281 Dementia in other diseases classified elsewhere with behavioral disturbance: Secondary | ICD-10-CM | POA: Diagnosis not present

## 2018-11-02 DIAGNOSIS — I10 Essential (primary) hypertension: Secondary | ICD-10-CM | POA: Diagnosis not present

## 2018-11-02 DIAGNOSIS — E785 Hyperlipidemia, unspecified: Secondary | ICD-10-CM | POA: Diagnosis not present

## 2018-11-02 DIAGNOSIS — G309 Alzheimer's disease, unspecified: Secondary | ICD-10-CM | POA: Diagnosis not present

## 2018-11-02 DIAGNOSIS — R296 Repeated falls: Secondary | ICD-10-CM | POA: Diagnosis not present

## 2018-11-02 DIAGNOSIS — I739 Peripheral vascular disease, unspecified: Secondary | ICD-10-CM | POA: Diagnosis not present

## 2018-11-03 DIAGNOSIS — F0281 Dementia in other diseases classified elsewhere with behavioral disturbance: Secondary | ICD-10-CM | POA: Diagnosis not present

## 2018-11-03 DIAGNOSIS — I739 Peripheral vascular disease, unspecified: Secondary | ICD-10-CM | POA: Diagnosis not present

## 2018-11-03 DIAGNOSIS — E785 Hyperlipidemia, unspecified: Secondary | ICD-10-CM | POA: Diagnosis not present

## 2018-11-03 DIAGNOSIS — G309 Alzheimer's disease, unspecified: Secondary | ICD-10-CM | POA: Diagnosis not present

## 2018-11-03 DIAGNOSIS — I10 Essential (primary) hypertension: Secondary | ICD-10-CM | POA: Diagnosis not present

## 2018-11-03 DIAGNOSIS — R296 Repeated falls: Secondary | ICD-10-CM | POA: Diagnosis not present

## 2018-11-04 DIAGNOSIS — E785 Hyperlipidemia, unspecified: Secondary | ICD-10-CM | POA: Diagnosis not present

## 2018-11-04 DIAGNOSIS — I739 Peripheral vascular disease, unspecified: Secondary | ICD-10-CM | POA: Diagnosis not present

## 2018-11-04 DIAGNOSIS — G309 Alzheimer's disease, unspecified: Secondary | ICD-10-CM | POA: Diagnosis not present

## 2018-11-04 DIAGNOSIS — R296 Repeated falls: Secondary | ICD-10-CM | POA: Diagnosis not present

## 2018-11-04 DIAGNOSIS — I10 Essential (primary) hypertension: Secondary | ICD-10-CM | POA: Diagnosis not present

## 2018-11-04 DIAGNOSIS — F0281 Dementia in other diseases classified elsewhere with behavioral disturbance: Secondary | ICD-10-CM | POA: Diagnosis not present

## 2018-11-07 DIAGNOSIS — E785 Hyperlipidemia, unspecified: Secondary | ICD-10-CM | POA: Diagnosis not present

## 2018-11-07 DIAGNOSIS — K5900 Constipation, unspecified: Secondary | ICD-10-CM | POA: Diagnosis not present

## 2018-11-07 DIAGNOSIS — I739 Peripheral vascular disease, unspecified: Secondary | ICD-10-CM | POA: Diagnosis not present

## 2018-11-07 DIAGNOSIS — I10 Essential (primary) hypertension: Secondary | ICD-10-CM | POA: Diagnosis not present

## 2018-11-07 DIAGNOSIS — F0281 Dementia in other diseases classified elsewhere with behavioral disturbance: Secondary | ICD-10-CM | POA: Diagnosis not present

## 2018-11-07 DIAGNOSIS — R296 Repeated falls: Secondary | ICD-10-CM | POA: Diagnosis not present

## 2018-11-07 DIAGNOSIS — G309 Alzheimer's disease, unspecified: Secondary | ICD-10-CM | POA: Diagnosis not present

## 2018-11-10 DIAGNOSIS — R296 Repeated falls: Secondary | ICD-10-CM | POA: Diagnosis not present

## 2018-11-10 DIAGNOSIS — G309 Alzheimer's disease, unspecified: Secondary | ICD-10-CM | POA: Diagnosis not present

## 2018-11-10 DIAGNOSIS — I10 Essential (primary) hypertension: Secondary | ICD-10-CM | POA: Diagnosis not present

## 2018-11-10 DIAGNOSIS — I739 Peripheral vascular disease, unspecified: Secondary | ICD-10-CM | POA: Diagnosis not present

## 2018-11-10 DIAGNOSIS — F0281 Dementia in other diseases classified elsewhere with behavioral disturbance: Secondary | ICD-10-CM | POA: Diagnosis not present

## 2018-11-10 DIAGNOSIS — E785 Hyperlipidemia, unspecified: Secondary | ICD-10-CM | POA: Diagnosis not present

## 2018-11-11 DIAGNOSIS — I739 Peripheral vascular disease, unspecified: Secondary | ICD-10-CM | POA: Diagnosis not present

## 2018-11-11 DIAGNOSIS — G309 Alzheimer's disease, unspecified: Secondary | ICD-10-CM | POA: Diagnosis not present

## 2018-11-11 DIAGNOSIS — F0281 Dementia in other diseases classified elsewhere with behavioral disturbance: Secondary | ICD-10-CM | POA: Diagnosis not present

## 2018-11-11 DIAGNOSIS — E785 Hyperlipidemia, unspecified: Secondary | ICD-10-CM | POA: Diagnosis not present

## 2018-11-11 DIAGNOSIS — I10 Essential (primary) hypertension: Secondary | ICD-10-CM | POA: Diagnosis not present

## 2018-11-11 DIAGNOSIS — R296 Repeated falls: Secondary | ICD-10-CM | POA: Diagnosis not present

## 2018-11-12 DIAGNOSIS — F064 Anxiety disorder due to known physiological condition: Secondary | ICD-10-CM | POA: Diagnosis not present

## 2018-11-13 DIAGNOSIS — F0281 Dementia in other diseases classified elsewhere with behavioral disturbance: Secondary | ICD-10-CM | POA: Diagnosis not present

## 2018-11-13 DIAGNOSIS — I739 Peripheral vascular disease, unspecified: Secondary | ICD-10-CM | POA: Diagnosis not present

## 2018-11-13 DIAGNOSIS — E785 Hyperlipidemia, unspecified: Secondary | ICD-10-CM | POA: Diagnosis not present

## 2018-11-13 DIAGNOSIS — G309 Alzheimer's disease, unspecified: Secondary | ICD-10-CM | POA: Diagnosis not present

## 2018-11-13 DIAGNOSIS — R296 Repeated falls: Secondary | ICD-10-CM | POA: Diagnosis not present

## 2018-11-13 DIAGNOSIS — I10 Essential (primary) hypertension: Secondary | ICD-10-CM | POA: Diagnosis not present

## 2018-11-18 DIAGNOSIS — I10 Essential (primary) hypertension: Secondary | ICD-10-CM | POA: Diagnosis not present

## 2018-11-18 DIAGNOSIS — E785 Hyperlipidemia, unspecified: Secondary | ICD-10-CM | POA: Diagnosis not present

## 2018-11-18 DIAGNOSIS — R296 Repeated falls: Secondary | ICD-10-CM | POA: Diagnosis not present

## 2018-11-18 DIAGNOSIS — G309 Alzheimer's disease, unspecified: Secondary | ICD-10-CM | POA: Diagnosis not present

## 2018-11-18 DIAGNOSIS — I739 Peripheral vascular disease, unspecified: Secondary | ICD-10-CM | POA: Diagnosis not present

## 2018-11-18 DIAGNOSIS — F0281 Dementia in other diseases classified elsewhere with behavioral disturbance: Secondary | ICD-10-CM | POA: Diagnosis not present

## 2018-11-20 DIAGNOSIS — I10 Essential (primary) hypertension: Secondary | ICD-10-CM | POA: Diagnosis not present

## 2018-11-20 DIAGNOSIS — G309 Alzheimer's disease, unspecified: Secondary | ICD-10-CM | POA: Diagnosis not present

## 2018-11-20 DIAGNOSIS — F0281 Dementia in other diseases classified elsewhere with behavioral disturbance: Secondary | ICD-10-CM | POA: Diagnosis not present

## 2018-11-20 DIAGNOSIS — I739 Peripheral vascular disease, unspecified: Secondary | ICD-10-CM | POA: Diagnosis not present

## 2018-11-20 DIAGNOSIS — R296 Repeated falls: Secondary | ICD-10-CM | POA: Diagnosis not present

## 2018-11-20 DIAGNOSIS — E785 Hyperlipidemia, unspecified: Secondary | ICD-10-CM | POA: Diagnosis not present

## 2018-11-22 DIAGNOSIS — K5900 Constipation, unspecified: Secondary | ICD-10-CM | POA: Diagnosis not present

## 2018-11-22 DIAGNOSIS — I739 Peripheral vascular disease, unspecified: Secondary | ICD-10-CM | POA: Diagnosis not present

## 2018-11-22 DIAGNOSIS — I10 Essential (primary) hypertension: Secondary | ICD-10-CM | POA: Diagnosis not present

## 2018-11-22 DIAGNOSIS — G309 Alzheimer's disease, unspecified: Secondary | ICD-10-CM | POA: Diagnosis not present

## 2018-11-22 DIAGNOSIS — H409 Unspecified glaucoma: Secondary | ICD-10-CM | POA: Diagnosis not present

## 2018-11-22 DIAGNOSIS — Z681 Body mass index (BMI) 19 or less, adult: Secondary | ICD-10-CM | POA: Diagnosis not present

## 2018-11-22 DIAGNOSIS — S32000D Wedge compression fracture of unspecified lumbar vertebra, subsequent encounter for fracture with routine healing: Secondary | ICD-10-CM | POA: Diagnosis not present

## 2018-11-22 DIAGNOSIS — R296 Repeated falls: Secondary | ICD-10-CM | POA: Diagnosis not present

## 2018-11-22 DIAGNOSIS — F0281 Dementia in other diseases classified elsewhere with behavioral disturbance: Secondary | ICD-10-CM | POA: Diagnosis not present

## 2018-11-22 DIAGNOSIS — E785 Hyperlipidemia, unspecified: Secondary | ICD-10-CM | POA: Diagnosis not present

## 2018-11-22 DIAGNOSIS — M810 Age-related osteoporosis without current pathological fracture: Secondary | ICD-10-CM | POA: Diagnosis not present

## 2018-11-22 DIAGNOSIS — F418 Other specified anxiety disorders: Secondary | ICD-10-CM | POA: Diagnosis not present

## 2018-11-22 DIAGNOSIS — S81811D Laceration without foreign body, right lower leg, subsequent encounter: Secondary | ICD-10-CM | POA: Diagnosis not present

## 2018-11-23 DIAGNOSIS — R296 Repeated falls: Secondary | ICD-10-CM | POA: Diagnosis not present

## 2018-11-23 DIAGNOSIS — I10 Essential (primary) hypertension: Secondary | ICD-10-CM | POA: Diagnosis not present

## 2018-11-23 DIAGNOSIS — I739 Peripheral vascular disease, unspecified: Secondary | ICD-10-CM | POA: Diagnosis not present

## 2018-11-23 DIAGNOSIS — F0281 Dementia in other diseases classified elsewhere with behavioral disturbance: Secondary | ICD-10-CM | POA: Diagnosis not present

## 2018-11-23 DIAGNOSIS — G309 Alzheimer's disease, unspecified: Secondary | ICD-10-CM | POA: Diagnosis not present

## 2018-11-23 DIAGNOSIS — E785 Hyperlipidemia, unspecified: Secondary | ICD-10-CM | POA: Diagnosis not present

## 2018-11-24 ENCOUNTER — Other Ambulatory Visit: Payer: Self-pay

## 2018-11-25 DIAGNOSIS — E785 Hyperlipidemia, unspecified: Secondary | ICD-10-CM | POA: Diagnosis not present

## 2018-11-25 DIAGNOSIS — R296 Repeated falls: Secondary | ICD-10-CM | POA: Diagnosis not present

## 2018-11-25 DIAGNOSIS — I10 Essential (primary) hypertension: Secondary | ICD-10-CM | POA: Diagnosis not present

## 2018-11-25 DIAGNOSIS — G309 Alzheimer's disease, unspecified: Secondary | ICD-10-CM | POA: Diagnosis not present

## 2018-11-25 DIAGNOSIS — I739 Peripheral vascular disease, unspecified: Secondary | ICD-10-CM | POA: Diagnosis not present

## 2018-11-25 DIAGNOSIS — F0281 Dementia in other diseases classified elsewhere with behavioral disturbance: Secondary | ICD-10-CM | POA: Diagnosis not present

## 2018-11-26 DIAGNOSIS — G301 Alzheimer's disease with late onset: Secondary | ICD-10-CM | POA: Diagnosis not present

## 2018-11-26 DIAGNOSIS — G4701 Insomnia due to medical condition: Secondary | ICD-10-CM | POA: Diagnosis not present

## 2018-11-26 DIAGNOSIS — F064 Anxiety disorder due to known physiological condition: Secondary | ICD-10-CM | POA: Diagnosis not present

## 2018-11-26 DIAGNOSIS — F0281 Dementia in other diseases classified elsewhere with behavioral disturbance: Secondary | ICD-10-CM | POA: Diagnosis not present

## 2018-11-26 DIAGNOSIS — F331 Major depressive disorder, recurrent, moderate: Secondary | ICD-10-CM | POA: Diagnosis not present

## 2018-11-28 DIAGNOSIS — E785 Hyperlipidemia, unspecified: Secondary | ICD-10-CM | POA: Diagnosis not present

## 2018-11-28 DIAGNOSIS — G309 Alzheimer's disease, unspecified: Secondary | ICD-10-CM | POA: Diagnosis not present

## 2018-11-28 DIAGNOSIS — G894 Chronic pain syndrome: Secondary | ICD-10-CM | POA: Diagnosis not present

## 2018-11-28 DIAGNOSIS — R296 Repeated falls: Secondary | ICD-10-CM | POA: Diagnosis not present

## 2018-11-28 DIAGNOSIS — I739 Peripheral vascular disease, unspecified: Secondary | ICD-10-CM | POA: Diagnosis not present

## 2018-11-28 DIAGNOSIS — I10 Essential (primary) hypertension: Secondary | ICD-10-CM | POA: Diagnosis not present

## 2018-11-28 DIAGNOSIS — F0281 Dementia in other diseases classified elsewhere with behavioral disturbance: Secondary | ICD-10-CM | POA: Diagnosis not present

## 2018-12-02 DIAGNOSIS — I739 Peripheral vascular disease, unspecified: Secondary | ICD-10-CM | POA: Diagnosis not present

## 2018-12-02 DIAGNOSIS — G309 Alzheimer's disease, unspecified: Secondary | ICD-10-CM | POA: Diagnosis not present

## 2018-12-02 DIAGNOSIS — F0281 Dementia in other diseases classified elsewhere with behavioral disturbance: Secondary | ICD-10-CM | POA: Diagnosis not present

## 2018-12-02 DIAGNOSIS — E785 Hyperlipidemia, unspecified: Secondary | ICD-10-CM | POA: Diagnosis not present

## 2018-12-02 DIAGNOSIS — I10 Essential (primary) hypertension: Secondary | ICD-10-CM | POA: Diagnosis not present

## 2018-12-02 DIAGNOSIS — R296 Repeated falls: Secondary | ICD-10-CM | POA: Diagnosis not present

## 2018-12-04 DIAGNOSIS — R296 Repeated falls: Secondary | ICD-10-CM | POA: Diagnosis not present

## 2018-12-04 DIAGNOSIS — F0281 Dementia in other diseases classified elsewhere with behavioral disturbance: Secondary | ICD-10-CM | POA: Diagnosis not present

## 2018-12-04 DIAGNOSIS — E785 Hyperlipidemia, unspecified: Secondary | ICD-10-CM | POA: Diagnosis not present

## 2018-12-04 DIAGNOSIS — I739 Peripheral vascular disease, unspecified: Secondary | ICD-10-CM | POA: Diagnosis not present

## 2018-12-04 DIAGNOSIS — G309 Alzheimer's disease, unspecified: Secondary | ICD-10-CM | POA: Diagnosis not present

## 2018-12-04 DIAGNOSIS — I10 Essential (primary) hypertension: Secondary | ICD-10-CM | POA: Diagnosis not present

## 2018-12-05 DIAGNOSIS — H409 Unspecified glaucoma: Secondary | ICD-10-CM | POA: Diagnosis not present

## 2018-12-05 DIAGNOSIS — R296 Repeated falls: Secondary | ICD-10-CM | POA: Diagnosis not present

## 2018-12-05 DIAGNOSIS — M25569 Pain in unspecified knee: Secondary | ICD-10-CM | POA: Diagnosis not present

## 2018-12-09 DIAGNOSIS — R296 Repeated falls: Secondary | ICD-10-CM | POA: Diagnosis not present

## 2018-12-09 DIAGNOSIS — F0281 Dementia in other diseases classified elsewhere with behavioral disturbance: Secondary | ICD-10-CM | POA: Diagnosis not present

## 2018-12-09 DIAGNOSIS — I739 Peripheral vascular disease, unspecified: Secondary | ICD-10-CM | POA: Diagnosis not present

## 2018-12-09 DIAGNOSIS — I10 Essential (primary) hypertension: Secondary | ICD-10-CM | POA: Diagnosis not present

## 2018-12-09 DIAGNOSIS — E785 Hyperlipidemia, unspecified: Secondary | ICD-10-CM | POA: Diagnosis not present

## 2018-12-09 DIAGNOSIS — G309 Alzheimer's disease, unspecified: Secondary | ICD-10-CM | POA: Diagnosis not present

## 2018-12-11 DIAGNOSIS — F0281 Dementia in other diseases classified elsewhere with behavioral disturbance: Secondary | ICD-10-CM | POA: Diagnosis not present

## 2018-12-11 DIAGNOSIS — G309 Alzheimer's disease, unspecified: Secondary | ICD-10-CM | POA: Diagnosis not present

## 2018-12-11 DIAGNOSIS — I739 Peripheral vascular disease, unspecified: Secondary | ICD-10-CM | POA: Diagnosis not present

## 2018-12-11 DIAGNOSIS — R296 Repeated falls: Secondary | ICD-10-CM | POA: Diagnosis not present

## 2018-12-11 DIAGNOSIS — I10 Essential (primary) hypertension: Secondary | ICD-10-CM | POA: Diagnosis not present

## 2018-12-11 DIAGNOSIS — E785 Hyperlipidemia, unspecified: Secondary | ICD-10-CM | POA: Diagnosis not present

## 2018-12-15 DIAGNOSIS — G309 Alzheimer's disease, unspecified: Secondary | ICD-10-CM | POA: Diagnosis not present

## 2018-12-15 DIAGNOSIS — R296 Repeated falls: Secondary | ICD-10-CM | POA: Diagnosis not present

## 2018-12-15 DIAGNOSIS — I10 Essential (primary) hypertension: Secondary | ICD-10-CM | POA: Diagnosis not present

## 2018-12-15 DIAGNOSIS — F0281 Dementia in other diseases classified elsewhere with behavioral disturbance: Secondary | ICD-10-CM | POA: Diagnosis not present

## 2018-12-15 DIAGNOSIS — E785 Hyperlipidemia, unspecified: Secondary | ICD-10-CM | POA: Diagnosis not present

## 2018-12-15 DIAGNOSIS — I739 Peripheral vascular disease, unspecified: Secondary | ICD-10-CM | POA: Diagnosis not present

## 2018-12-16 DIAGNOSIS — E785 Hyperlipidemia, unspecified: Secondary | ICD-10-CM | POA: Diagnosis not present

## 2018-12-16 DIAGNOSIS — R296 Repeated falls: Secondary | ICD-10-CM | POA: Diagnosis not present

## 2018-12-16 DIAGNOSIS — I739 Peripheral vascular disease, unspecified: Secondary | ICD-10-CM | POA: Diagnosis not present

## 2018-12-16 DIAGNOSIS — G309 Alzheimer's disease, unspecified: Secondary | ICD-10-CM | POA: Diagnosis not present

## 2018-12-16 DIAGNOSIS — M25569 Pain in unspecified knee: Secondary | ICD-10-CM | POA: Diagnosis not present

## 2018-12-16 DIAGNOSIS — D519 Vitamin B12 deficiency anemia, unspecified: Secondary | ICD-10-CM | POA: Diagnosis not present

## 2018-12-16 DIAGNOSIS — G894 Chronic pain syndrome: Secondary | ICD-10-CM | POA: Diagnosis not present

## 2018-12-16 DIAGNOSIS — I10 Essential (primary) hypertension: Secondary | ICD-10-CM | POA: Diagnosis not present

## 2018-12-16 DIAGNOSIS — F0281 Dementia in other diseases classified elsewhere with behavioral disturbance: Secondary | ICD-10-CM | POA: Diagnosis not present

## 2018-12-16 DIAGNOSIS — M1990 Unspecified osteoarthritis, unspecified site: Secondary | ICD-10-CM | POA: Diagnosis not present

## 2018-12-16 DIAGNOSIS — H409 Unspecified glaucoma: Secondary | ICD-10-CM | POA: Diagnosis not present

## 2018-12-16 DIAGNOSIS — K5900 Constipation, unspecified: Secondary | ICD-10-CM | POA: Diagnosis not present

## 2018-12-19 DIAGNOSIS — D519 Vitamin B12 deficiency anemia, unspecified: Secondary | ICD-10-CM | POA: Diagnosis not present

## 2018-12-19 DIAGNOSIS — H409 Unspecified glaucoma: Secondary | ICD-10-CM | POA: Diagnosis not present

## 2018-12-23 DIAGNOSIS — M810 Age-related osteoporosis without current pathological fracture: Secondary | ICD-10-CM | POA: Diagnosis not present

## 2018-12-23 DIAGNOSIS — G309 Alzheimer's disease, unspecified: Secondary | ICD-10-CM | POA: Diagnosis not present

## 2018-12-23 DIAGNOSIS — Z681 Body mass index (BMI) 19 or less, adult: Secondary | ICD-10-CM | POA: Diagnosis not present

## 2018-12-23 DIAGNOSIS — R296 Repeated falls: Secondary | ICD-10-CM | POA: Diagnosis not present

## 2018-12-23 DIAGNOSIS — F0281 Dementia in other diseases classified elsewhere with behavioral disturbance: Secondary | ICD-10-CM | POA: Diagnosis not present

## 2018-12-23 DIAGNOSIS — E785 Hyperlipidemia, unspecified: Secondary | ICD-10-CM | POA: Diagnosis not present

## 2018-12-23 DIAGNOSIS — K5900 Constipation, unspecified: Secondary | ICD-10-CM | POA: Diagnosis not present

## 2018-12-23 DIAGNOSIS — I10 Essential (primary) hypertension: Secondary | ICD-10-CM | POA: Diagnosis not present

## 2018-12-23 DIAGNOSIS — H409 Unspecified glaucoma: Secondary | ICD-10-CM | POA: Diagnosis not present

## 2018-12-23 DIAGNOSIS — F418 Other specified anxiety disorders: Secondary | ICD-10-CM | POA: Diagnosis not present

## 2018-12-23 DIAGNOSIS — S32000D Wedge compression fracture of unspecified lumbar vertebra, subsequent encounter for fracture with routine healing: Secondary | ICD-10-CM | POA: Diagnosis not present

## 2018-12-23 DIAGNOSIS — S0101XD Laceration without foreign body of scalp, subsequent encounter: Secondary | ICD-10-CM | POA: Diagnosis not present

## 2018-12-23 DIAGNOSIS — I739 Peripheral vascular disease, unspecified: Secondary | ICD-10-CM | POA: Diagnosis not present

## 2018-12-24 DIAGNOSIS — G4701 Insomnia due to medical condition: Secondary | ICD-10-CM | POA: Diagnosis not present

## 2018-12-24 DIAGNOSIS — G301 Alzheimer's disease with late onset: Secondary | ICD-10-CM | POA: Diagnosis not present

## 2018-12-24 DIAGNOSIS — F0281 Dementia in other diseases classified elsewhere with behavioral disturbance: Secondary | ICD-10-CM | POA: Diagnosis not present

## 2018-12-24 DIAGNOSIS — F064 Anxiety disorder due to known physiological condition: Secondary | ICD-10-CM | POA: Diagnosis not present

## 2018-12-24 DIAGNOSIS — F331 Major depressive disorder, recurrent, moderate: Secondary | ICD-10-CM | POA: Diagnosis not present

## 2018-12-25 DIAGNOSIS — E785 Hyperlipidemia, unspecified: Secondary | ICD-10-CM | POA: Diagnosis not present

## 2018-12-25 DIAGNOSIS — I10 Essential (primary) hypertension: Secondary | ICD-10-CM | POA: Diagnosis not present

## 2018-12-25 DIAGNOSIS — R296 Repeated falls: Secondary | ICD-10-CM | POA: Diagnosis not present

## 2018-12-25 DIAGNOSIS — I739 Peripheral vascular disease, unspecified: Secondary | ICD-10-CM | POA: Diagnosis not present

## 2018-12-25 DIAGNOSIS — G309 Alzheimer's disease, unspecified: Secondary | ICD-10-CM | POA: Diagnosis not present

## 2018-12-25 DIAGNOSIS — L603 Nail dystrophy: Secondary | ICD-10-CM | POA: Diagnosis not present

## 2018-12-25 DIAGNOSIS — Z79899 Other long term (current) drug therapy: Secondary | ICD-10-CM | POA: Diagnosis not present

## 2018-12-25 DIAGNOSIS — F0281 Dementia in other diseases classified elsewhere with behavioral disturbance: Secondary | ICD-10-CM | POA: Diagnosis not present

## 2018-12-25 DIAGNOSIS — Q845 Enlarged and hypertrophic nails: Secondary | ICD-10-CM | POA: Diagnosis not present

## 2018-12-26 DIAGNOSIS — I739 Peripheral vascular disease, unspecified: Secondary | ICD-10-CM | POA: Diagnosis not present

## 2018-12-26 DIAGNOSIS — F0281 Dementia in other diseases classified elsewhere with behavioral disturbance: Secondary | ICD-10-CM | POA: Diagnosis not present

## 2018-12-26 DIAGNOSIS — G309 Alzheimer's disease, unspecified: Secondary | ICD-10-CM | POA: Diagnosis not present

## 2018-12-26 DIAGNOSIS — G894 Chronic pain syndrome: Secondary | ICD-10-CM | POA: Diagnosis not present

## 2018-12-26 DIAGNOSIS — E785 Hyperlipidemia, unspecified: Secondary | ICD-10-CM | POA: Diagnosis not present

## 2018-12-26 DIAGNOSIS — I10 Essential (primary) hypertension: Secondary | ICD-10-CM | POA: Diagnosis not present

## 2018-12-26 DIAGNOSIS — R296 Repeated falls: Secondary | ICD-10-CM | POA: Diagnosis not present

## 2018-12-30 DIAGNOSIS — G309 Alzheimer's disease, unspecified: Secondary | ICD-10-CM | POA: Diagnosis not present

## 2018-12-30 DIAGNOSIS — I739 Peripheral vascular disease, unspecified: Secondary | ICD-10-CM | POA: Diagnosis not present

## 2018-12-30 DIAGNOSIS — E785 Hyperlipidemia, unspecified: Secondary | ICD-10-CM | POA: Diagnosis not present

## 2018-12-30 DIAGNOSIS — R296 Repeated falls: Secondary | ICD-10-CM | POA: Diagnosis not present

## 2018-12-30 DIAGNOSIS — F0281 Dementia in other diseases classified elsewhere with behavioral disturbance: Secondary | ICD-10-CM | POA: Diagnosis not present

## 2018-12-30 DIAGNOSIS — I10 Essential (primary) hypertension: Secondary | ICD-10-CM | POA: Diagnosis not present

## 2018-12-31 DIAGNOSIS — F0281 Dementia in other diseases classified elsewhere with behavioral disturbance: Secondary | ICD-10-CM | POA: Diagnosis not present

## 2018-12-31 DIAGNOSIS — G309 Alzheimer's disease, unspecified: Secondary | ICD-10-CM | POA: Diagnosis not present

## 2018-12-31 DIAGNOSIS — I739 Peripheral vascular disease, unspecified: Secondary | ICD-10-CM | POA: Diagnosis not present

## 2018-12-31 DIAGNOSIS — E785 Hyperlipidemia, unspecified: Secondary | ICD-10-CM | POA: Diagnosis not present

## 2018-12-31 DIAGNOSIS — I10 Essential (primary) hypertension: Secondary | ICD-10-CM | POA: Diagnosis not present

## 2018-12-31 DIAGNOSIS — R296 Repeated falls: Secondary | ICD-10-CM | POA: Diagnosis not present

## 2019-01-06 DIAGNOSIS — F0281 Dementia in other diseases classified elsewhere with behavioral disturbance: Secondary | ICD-10-CM | POA: Diagnosis not present

## 2019-01-06 DIAGNOSIS — E785 Hyperlipidemia, unspecified: Secondary | ICD-10-CM | POA: Diagnosis not present

## 2019-01-06 DIAGNOSIS — I739 Peripheral vascular disease, unspecified: Secondary | ICD-10-CM | POA: Diagnosis not present

## 2019-01-06 DIAGNOSIS — I10 Essential (primary) hypertension: Secondary | ICD-10-CM | POA: Diagnosis not present

## 2019-01-06 DIAGNOSIS — G309 Alzheimer's disease, unspecified: Secondary | ICD-10-CM | POA: Diagnosis not present

## 2019-01-06 DIAGNOSIS — R296 Repeated falls: Secondary | ICD-10-CM | POA: Diagnosis not present

## 2019-01-08 DIAGNOSIS — F064 Anxiety disorder due to known physiological condition: Secondary | ICD-10-CM | POA: Diagnosis not present

## 2019-01-09 DIAGNOSIS — I739 Peripheral vascular disease, unspecified: Secondary | ICD-10-CM | POA: Diagnosis not present

## 2019-01-09 DIAGNOSIS — G309 Alzheimer's disease, unspecified: Secondary | ICD-10-CM | POA: Diagnosis not present

## 2019-01-09 DIAGNOSIS — F0281 Dementia in other diseases classified elsewhere with behavioral disturbance: Secondary | ICD-10-CM | POA: Diagnosis not present

## 2019-01-09 DIAGNOSIS — R296 Repeated falls: Secondary | ICD-10-CM | POA: Diagnosis not present

## 2019-01-09 DIAGNOSIS — E785 Hyperlipidemia, unspecified: Secondary | ICD-10-CM | POA: Diagnosis not present

## 2019-01-09 DIAGNOSIS — I10 Essential (primary) hypertension: Secondary | ICD-10-CM | POA: Diagnosis not present

## 2019-01-13 DIAGNOSIS — I10 Essential (primary) hypertension: Secondary | ICD-10-CM | POA: Diagnosis not present

## 2019-01-13 DIAGNOSIS — E785 Hyperlipidemia, unspecified: Secondary | ICD-10-CM | POA: Diagnosis not present

## 2019-01-13 DIAGNOSIS — F0281 Dementia in other diseases classified elsewhere with behavioral disturbance: Secondary | ICD-10-CM | POA: Diagnosis not present

## 2019-01-13 DIAGNOSIS — R296 Repeated falls: Secondary | ICD-10-CM | POA: Diagnosis not present

## 2019-01-13 DIAGNOSIS — I739 Peripheral vascular disease, unspecified: Secondary | ICD-10-CM | POA: Diagnosis not present

## 2019-01-13 DIAGNOSIS — G309 Alzheimer's disease, unspecified: Secondary | ICD-10-CM | POA: Diagnosis not present

## 2019-01-16 DIAGNOSIS — G309 Alzheimer's disease, unspecified: Secondary | ICD-10-CM | POA: Diagnosis not present

## 2019-01-16 DIAGNOSIS — I739 Peripheral vascular disease, unspecified: Secondary | ICD-10-CM | POA: Diagnosis not present

## 2019-01-16 DIAGNOSIS — R296 Repeated falls: Secondary | ICD-10-CM | POA: Diagnosis not present

## 2019-01-16 DIAGNOSIS — F0281 Dementia in other diseases classified elsewhere with behavioral disturbance: Secondary | ICD-10-CM | POA: Diagnosis not present

## 2019-01-16 DIAGNOSIS — I10 Essential (primary) hypertension: Secondary | ICD-10-CM | POA: Diagnosis not present

## 2019-01-16 DIAGNOSIS — E785 Hyperlipidemia, unspecified: Secondary | ICD-10-CM | POA: Diagnosis not present

## 2019-01-20 DIAGNOSIS — I739 Peripheral vascular disease, unspecified: Secondary | ICD-10-CM | POA: Diagnosis not present

## 2019-01-20 DIAGNOSIS — G309 Alzheimer's disease, unspecified: Secondary | ICD-10-CM | POA: Diagnosis not present

## 2019-01-20 DIAGNOSIS — E785 Hyperlipidemia, unspecified: Secondary | ICD-10-CM | POA: Diagnosis not present

## 2019-01-20 DIAGNOSIS — F0281 Dementia in other diseases classified elsewhere with behavioral disturbance: Secondary | ICD-10-CM | POA: Diagnosis not present

## 2019-01-20 DIAGNOSIS — I10 Essential (primary) hypertension: Secondary | ICD-10-CM | POA: Diagnosis not present

## 2019-01-20 DIAGNOSIS — R296 Repeated falls: Secondary | ICD-10-CM | POA: Diagnosis not present

## 2019-01-22 DIAGNOSIS — G301 Alzheimer's disease with late onset: Secondary | ICD-10-CM | POA: Diagnosis not present

## 2019-01-22 DIAGNOSIS — G4701 Insomnia due to medical condition: Secondary | ICD-10-CM | POA: Diagnosis not present

## 2019-01-22 DIAGNOSIS — F064 Anxiety disorder due to known physiological condition: Secondary | ICD-10-CM | POA: Diagnosis not present

## 2019-01-22 DIAGNOSIS — F0281 Dementia in other diseases classified elsewhere with behavioral disturbance: Secondary | ICD-10-CM | POA: Diagnosis not present

## 2019-01-22 DIAGNOSIS — F331 Major depressive disorder, recurrent, moderate: Secondary | ICD-10-CM | POA: Diagnosis not present

## 2019-01-23 DIAGNOSIS — G894 Chronic pain syndrome: Secondary | ICD-10-CM | POA: Diagnosis not present

## 2019-01-29 DIAGNOSIS — G301 Alzheimer's disease with late onset: Secondary | ICD-10-CM | POA: Diagnosis not present

## 2019-01-29 DIAGNOSIS — F0281 Dementia in other diseases classified elsewhere with behavioral disturbance: Secondary | ICD-10-CM | POA: Diagnosis not present

## 2019-01-30 DIAGNOSIS — M1909 Primary osteoarthritis, other specified site: Secondary | ICD-10-CM | POA: Diagnosis not present

## 2019-01-30 DIAGNOSIS — H4010X0 Unspecified open-angle glaucoma, stage unspecified: Secondary | ICD-10-CM | POA: Diagnosis not present

## 2019-01-30 DIAGNOSIS — K5900 Constipation, unspecified: Secondary | ICD-10-CM | POA: Diagnosis not present

## 2019-01-30 DIAGNOSIS — L84 Corns and callosities: Secondary | ICD-10-CM | POA: Diagnosis not present

## 2019-02-05 DIAGNOSIS — F064 Anxiety disorder due to known physiological condition: Secondary | ICD-10-CM | POA: Diagnosis not present

## 2019-02-20 DIAGNOSIS — G4701 Insomnia due to medical condition: Secondary | ICD-10-CM | POA: Diagnosis not present

## 2019-02-20 DIAGNOSIS — F0281 Dementia in other diseases classified elsewhere with behavioral disturbance: Secondary | ICD-10-CM | POA: Diagnosis not present

## 2019-02-20 DIAGNOSIS — G301 Alzheimer's disease with late onset: Secondary | ICD-10-CM | POA: Diagnosis not present

## 2019-02-20 DIAGNOSIS — F064 Anxiety disorder due to known physiological condition: Secondary | ICD-10-CM | POA: Diagnosis not present

## 2019-02-20 DIAGNOSIS — F331 Major depressive disorder, recurrent, moderate: Secondary | ICD-10-CM | POA: Diagnosis not present

## 2019-02-23 ENCOUNTER — Ambulatory Visit (INDEPENDENT_AMBULATORY_CARE_PROVIDER_SITE_OTHER): Payer: Medicare Other | Admitting: Podiatry

## 2019-02-23 ENCOUNTER — Other Ambulatory Visit: Payer: Self-pay

## 2019-02-23 ENCOUNTER — Ambulatory Visit (INDEPENDENT_AMBULATORY_CARE_PROVIDER_SITE_OTHER): Payer: Medicare Other

## 2019-02-23 ENCOUNTER — Other Ambulatory Visit: Payer: Self-pay | Admitting: Podiatry

## 2019-02-23 VITALS — BP 126/90 | HR 70

## 2019-02-23 DIAGNOSIS — M7750 Other enthesopathy of unspecified foot: Secondary | ICD-10-CM

## 2019-02-23 DIAGNOSIS — M7751 Other enthesopathy of right foot: Secondary | ICD-10-CM | POA: Diagnosis not present

## 2019-02-23 DIAGNOSIS — L989 Disorder of the skin and subcutaneous tissue, unspecified: Secondary | ICD-10-CM | POA: Diagnosis not present

## 2019-02-23 DIAGNOSIS — M79671 Pain in right foot: Secondary | ICD-10-CM

## 2019-02-23 DIAGNOSIS — M722 Plantar fascial fibromatosis: Secondary | ICD-10-CM

## 2019-02-25 NOTE — Progress Notes (Signed)
   Subjective: 85 y.o. female presenting to the office today with a chief complaint of pain to the lateral right midfoot secondary to a callus lesion that appeared a few months ago. Walking increases the pain. She has not had any treatment for the complaint. Patient is here for further evaluation and treatment.   Past Medical History:  Diagnosis Date  . Alzheimer's dementia with behavioral disturbance ([place])    per Dr. Marco Collie 08/2015  . Anxiety   . Chronic diarrhea   . CKD (chronic kidney disease), stage II    per medical record per Dr. Marco Collie   . Colon polyps   . Dementia ([place])   . HTN (hypertension)      Objective:  Physical Exam General: Alert and oriented x3 in no acute distress  Dermatology: Hyperkeratotic lesion(s) present on the right 5th metatarsal tubercle. Pain on palpation with a central nucleated core noted. Skin is warm, dry and supple bilateral lower extremities. Negative for open lesions or macerations.  Vascular: Palpable pedal pulses bilaterally. No edema or erythema noted. Capillary refill within normal limits.  Neurological: Epicritic and protective threshold grossly intact bilaterally.   Musculoskeletal Exam: Pain on palpation at the keratotic lesion(s) noted. Range of motion within normal limits bilateral. Muscle strength 5/5 in all groups bilateral.  Radiographic Exam:  Normal osseous mineralization. Joint spaces preserved. No fracture/dislocation/boney destruction.    Assessment: 1. Pre-ulcerative callus lesion noted to the right 5th met tubercle    Plan of Care:  1. Patient evaluated. X-Rays reviewed.  2. Excisional debridement of keratoic lesion(s) using a chisel blade was performed without incident.  3. Dressed area with light dressing. 4. Recommended good shoe gear.  5. Patient is to return to the clinic PRN.   Daughter is "Darcie".    Edrick Kins, DPM [facility]  Dr. Edrick Kins, [place]                                         [place], [place] [address]                Office [phone]  Fax [phone]

## 2019-02-27 DIAGNOSIS — G894 Chronic pain syndrome: Secondary | ICD-10-CM | POA: Diagnosis not present

## 2019-03-13 DIAGNOSIS — G301 Alzheimer's disease with late onset: Secondary | ICD-10-CM | POA: Diagnosis not present

## 2019-03-13 DIAGNOSIS — F331 Major depressive disorder, recurrent, moderate: Secondary | ICD-10-CM | POA: Diagnosis not present

## 2019-03-13 DIAGNOSIS — F064 Anxiety disorder due to known physiological condition: Secondary | ICD-10-CM | POA: Diagnosis not present

## 2019-03-13 DIAGNOSIS — F0281 Dementia in other diseases classified elsewhere with behavioral disturbance: Secondary | ICD-10-CM | POA: Diagnosis not present

## 2019-03-24 DIAGNOSIS — Q845 Enlarged and hypertrophic nails: Secondary | ICD-10-CM | POA: Diagnosis not present

## 2019-03-24 DIAGNOSIS — I739 Peripheral vascular disease, unspecified: Secondary | ICD-10-CM | POA: Diagnosis not present

## 2019-03-24 DIAGNOSIS — B351 Tinea unguium: Secondary | ICD-10-CM | POA: Diagnosis not present

## 2019-07-20 ENCOUNTER — Emergency Department (HOSPITAL_COMMUNITY)

## 2019-07-20 ENCOUNTER — Encounter (HOSPITAL_COMMUNITY): Payer: Self-pay | Admitting: Emergency Medicine

## 2019-07-20 ENCOUNTER — Emergency Department (HOSPITAL_COMMUNITY)
Admission: EM | Admit: 2019-07-20 | Discharge: 2019-07-20 | Disposition: A | Discharge status: Home / Self Care | Attending: Emergency Medicine | Admitting: Emergency Medicine

## 2019-07-20 ENCOUNTER — Other Ambulatory Visit: Payer: Self-pay

## 2019-07-20 DIAGNOSIS — Y92129 Unspecified place in nursing home as the place of occurrence of the external cause: Secondary | ICD-10-CM | POA: Insufficient documentation

## 2019-07-20 DIAGNOSIS — N182 Chronic kidney disease, stage 2 (mild): Secondary | ICD-10-CM | POA: Diagnosis not present

## 2019-07-20 DIAGNOSIS — G309 Alzheimer's disease, unspecified: Secondary | ICD-10-CM | POA: Insufficient documentation

## 2019-07-20 DIAGNOSIS — Z23 Encounter for immunization: Secondary | ICD-10-CM | POA: Insufficient documentation

## 2019-07-20 DIAGNOSIS — S22080A Wedge compression fracture of T11-T12 vertebra, initial encounter for closed fracture: Secondary | ICD-10-CM | POA: Diagnosis not present

## 2019-07-20 DIAGNOSIS — S61511A Laceration without foreign body of right wrist, initial encounter: Secondary | ICD-10-CM | POA: Diagnosis present

## 2019-07-20 DIAGNOSIS — Y999 Unspecified external cause status: Secondary | ICD-10-CM | POA: Insufficient documentation

## 2019-07-20 DIAGNOSIS — S20411A Abrasion of right back wall of thorax, initial encounter: Secondary | ICD-10-CM | POA: Diagnosis not present

## 2019-07-20 DIAGNOSIS — W19XXXA Unspecified fall, initial encounter: Secondary | ICD-10-CM | POA: Diagnosis not present

## 2019-07-20 DIAGNOSIS — S41111A Laceration without foreign body of right upper arm, initial encounter: Secondary | ICD-10-CM

## 2019-07-20 DIAGNOSIS — I129 Hypertensive chronic kidney disease with stage 1 through stage 4 chronic kidney disease, or unspecified chronic kidney disease: Secondary | ICD-10-CM | POA: Insufficient documentation

## 2019-07-20 DIAGNOSIS — Z79899 Other long term (current) drug therapy: Secondary | ICD-10-CM | POA: Insufficient documentation

## 2019-07-20 DIAGNOSIS — S61411A Laceration without foreign body of right hand, initial encounter: Secondary | ICD-10-CM | POA: Diagnosis not present

## 2019-07-20 DIAGNOSIS — Y9389 Activity, other specified: Secondary | ICD-10-CM | POA: Insufficient documentation

## 2019-07-20 MED ORDER — TETANUS-DIPHTH-ACELL PERTUSSIS 5-2.5-18.5 LF-MCG/0.5 IM SUSP
0.5000 mL | Freq: Once | INTRAMUSCULAR | Status: AC
  Administered 2019-07-20: 0.5 mL via INTRAMUSCULAR
  Filled 2019-07-20: qty 0.5

## 2019-07-20 MED ORDER — FENTANYL CITRATE (PF) 100 MCG/2ML IJ SOLN
50.0000 ug | Freq: Once | INTRAMUSCULAR | Status: AC
  Administered 2019-07-20: 50 ug via INTRAMUSCULAR
  Filled 2019-07-20: qty 2

## 2019-07-20 MED ORDER — LIDOCAINE-EPINEPHRINE-TETRACAINE (LET) TOPICAL GEL
6.0000 mL | Freq: Once | TOPICAL | Status: AC
  Administered 2019-07-20: 6 mL via TOPICAL
  Filled 2019-07-20: qty 6

## 2019-07-20 MED ORDER — LORAZEPAM 2 MG/ML IJ SOLN
0.5000 mg | Freq: Once | INTRAMUSCULAR | Status: AC
  Administered 2019-07-20: 0.5 mg via INTRAMUSCULAR
  Filled 2019-07-20: qty 1

## 2019-07-20 MED ORDER — LORAZEPAM 2 MG/ML IJ SOLN
0.5000 mg | INTRAMUSCULAR | Status: AC
  Administered 2019-07-20: 0.5 mg via INTRAMUSCULAR
  Filled 2019-07-20: qty 1

## 2019-07-20 NOTE — ED Notes (Signed)
Patient's dressing holding without any sings of breakthrough bleeding, quick-clot applied by PA prior to dressing. Patient is in NAD.

## 2019-07-20 NOTE — ED Notes (Signed)
[facility] called for transport back to [facility] on [address].

## 2019-07-20 NOTE — Discharge Instructions (Addendum)
Please call and schedule an appointment for outpatient follow up with Dr. Erlinda Hong with orthopaedics regarding a compression fracture of the T12 vertebrae.

## 2019-07-20 NOTE — ED Notes (Signed)
Derma clips given to [facility]

## 2019-07-20 NOTE — ED Notes (Signed)
Unable to obtain 12 lead due to patients increased agitation. PA at bedside and made aware.

## 2019-07-20 NOTE — ED Provider Notes (Signed)
[facility] DEPT Provider Note   CSN: [id number] Arrival date & time: 07/20/19  [id number]     History Chief Complaint  Patient presents with  . Fall    Christina Atkinson is a 86 y.o. female.  LEVEL FIVE CAVEAT DUE TO DEMENTIA  HPI HPI Comments: Christina Atkinson is a 86 y.o. female with a history of dementia who presents to the Emergency Department via EMS from [facility] due to a fall that occurred prior to arrival.  Patient has a history of dementia and is a level 5 caveat.  Per EMS, patient had an unwitnessed fall and was yelling for staff assistance.  She was placed in a c-collar by EMS.  They were told she is not on blood thinning medications and records do not indicate any blood thinning medications.       Past Medical History:  Diagnosis Date  . Alzheimer's dementia with behavioral disturbance ([place])    per Dr. Marco Collie 08/2015  . Anxiety   . Chronic diarrhea   . CKD (chronic kidney disease), stage II    per medical record per Dr. Marco Collie   . Colon polyps   . Dementia ([place])   . HTN (hypertension)     Patient Active Problem List   Diagnosis Date Noted  . History of colon polyps 08/15/2016  . CKD (chronic kidney disease), stage II   . Alzheimer's dementia with behavioral disturbance ([place])   . HTN (hypertension) 01/23/2016  . Dementia ([place]) 01/23/2016  . Chronic diarrhea of unknown origin 01/23/2016  . Rhinorrhea 01/23/2016    Past Surgical History:  Procedure Laterality Date  . CESAREAN SECTION    . TONSILLECTOMY       OB History   No obstetric history on file.     Family History  Problem Relation Age of Onset  . Heart attack Father   . Alzheimer's disease Sister     Social History   Tobacco Use  . Smoking status: Former Smoker    Quit date: 09/14/1986    Years since quitting: 32.8  . Smokeless tobacco: Never Used  Substance Use Topics  . Alcohol use: No  . Drug use: No    Home Medications Prior to  Admission medications   Medication Sig Start Date End Date Taking? Authorizing Provider  acetaminophen (TYLENOL) 325 MG tablet Take 325 mg by mouth 2 (two) times daily. Knee pain    [provider]  atenolol-chlorthalidone (TENORETIC) 100-25 MG tablet TAKE 1 TABLET BY MOUTH EVERY DAY 01/16/17   Henson, Vickie L, NP-C  bacitracin ointment Apply 1 application topically 2 (two) times daily. 05/17/18   Varney Biles, MD  Budesonide 9 MG TB24 Take 1 tablet by mouth daily. 10/12/16   Doran Stabler, MD  Calcium Carbonate-Vitamin D3 (CALCIUM 600/VITAMIN D) 600-400 MG-UNIT TABS Take 1 tablet by mouth 2 (two) times daily.    [provider]  diclofenac sodium (VOLTAREN) 1 % GEL Apply 4 g topically 3 (three) times daily. To the right knww     [provider]  donepezil (ARICEPT) 10 MG tablet TAKE 1 TABLET BY MOUTH ONCE DAILY 04/09/17   Star Age, MD  escitalopram (LEXAPRO) 10 MG tablet Take 10 mg by mouth daily.    [provider]  Glucosamine 500 MG CAPS Take 1,500 mg by mouth daily.    [provider]  latanoprost (XALATAN) 0.005 % ophthalmic solution Place 1 drop into both eyes at bedtime.  [provider]  lisinopril (PRINIVIL,ZESTRIL) 5 MG tablet Take 5 mg by mouth daily.    [provider]  LORazepam (ATIVAN) 0.5 MG tablet Take 0.5 mg by mouth 2 (two) times daily.    [provider]  LORazepam (ATIVAN) 0.5 MG tablet Take 0.25 mg by mouth 2 (two) times daily as needed for anxiety.    [provider]  Melatonin 10 MG CAPS Take 10 mg by mouth at bedtime.    [provider]  memantine (NAMENDA XR) 14 MG CP24 24 hr capsule Take 1 capsule (14 mg total) by mouth daily. 11/08/16   Star Age, MD  memantine (NAMENDA) 10 MG tablet Take 5-10 mg by mouth 2 (two) times daily.     [provider]  polyethylene glycol (MIRALAX / GLYCOLAX) packet Take 17 g by mouth daily as needed for mild constipation.     [provider]  traMADol (ULTRAM) 50 MG tablet Take 50 mg by mouth 2 (two) times daily.    [provider]  vitamin B-12 (CYANOCOBALAMIN) 100 MCG tablet Take 100 mcg by mouth daily.    [provider]    Allergies    Codeine  Review of Systems   Review of Systems  Unable to perform ROS: Dementia   Physical Exam Updated Vital Signs BP (!) 189/81 (BP Location: Left Arm)   Pulse 68   Resp 18   Ht 5\' 7"  (1.702 m)   Wt 77 kg   SpO2 99%   BMI 26.59 kg/m   Physical Exam Vitals and nursing note reviewed.  Constitutional:      General: She is in acute distress.     Appearance: Normal appearance. She is normal weight. She is not ill-appearing, toxic-appearing or diaphoretic.     Comments: Elderly, demented, Caucasian female.  She does not answer questions clearly or coherently.  HENT:     Head: Normocephalic.     Comments: No obvious lacerations or bleeding noted. No crepitus.     Right Ear: External ear normal.     Left Ear: External ear normal.     Nose: Nose normal.  Eyes:     General: No scleral icterus.       Right eye: No discharge.        Left eye: No discharge.     Pupils: Pupils are equal, round, and reactive to light.  Neck:     Comments: C-collar placed by EMS. Cardiovascular:     Rate and Rhythm: Normal rate and regular rhythm.     Heart sounds: Murmur present. No friction rub. No gallop.   Pulmonary:     Effort: Pulmonary effort is normal. No respiratory distress.     Breath sounds: Normal breath sounds. No stridor. No wheezing, rhonchi or rales.  Abdominal:     General: Abdomen is flat.     Palpations: Abdomen is soft.     Tenderness: There is no abdominal tenderness.     Comments: No overlying signs of bruising, erythema, trauma to the abdominal region.  Abdomen is soft.  Musculoskeletal:     Comments: Mild abrasion noted to the right posterior thoracic region.  No laceration noted.  No signs of bleeding. Able to flex hips and  knees bilaterally without signs of pain.   Skin:    General: Skin is warm and dry.     Comments: Significant skin tear noted to the right arm.  Small skin tear noted to the dorsal aspect of the  right hand.  Extremely friable skin noted to the bilateral upper extremities with diffuse bruising.  Please see image below.  Neurological:     Mental Status: Mental status is at baseline. She is disoriented.       ED Results / Procedures / Treatments   Labs (all labs ordered are listed, but only abnormal results are displayed) Labs Reviewed - No data to display  EKG None  Radiology DG Chest 2 View  Result Date: 07/20/2019 CLINICAL DATA:  Fall with leading to the right forearm. EXAM: CHEST - 2 VIEW COMPARISON:  None. FINDINGS: Borderline heart size with bulky mitral annular calcification. There is no edema, consolidation, effusion, or pneumothorax. Thoracic spine described separately.  No visible rib fracture. Osteopenia. IMPRESSION: No evidence of active cardiopulmonary disease. Electronically Signed   By: Monte Fantasia M.D.   On: 07/20/2019 05:00   DG Thoracic Spine 2 View  Result Date: 07/20/2019 CLINICAL DATA:  [place]. EXAM: THORACIC SPINE 2 VIEWS COMPARISON:  None. FINDINGS: Thoracolumbar scoliosis and advanced degenerative spondylosis. There is a compression deformity of T12 with a vertebral plana appearance. This was not present on a prior MRI from 2019 and is age indeterminate. There is a remote compression fracture of L2. The other thoracic vertebral bodies appear normally aligned on the lateral film. No other compression deformities are identified. The visualized lungs are grossly clear. IMPRESSION: 1. Compression deformity of T12 with a vertebral plana appearance, age indeterminate. 2. Remote L2 compression fracture. 3. Scoliosis and advanced degenerative spondylosis of the thoracic spine. Electronically Signed   By: Marijo Sanes M.D.   On: 07/20/2019 05:05   DG Pelvis 1-2  Views  Result Date: 07/20/2019 CLINICAL DATA:  [place]. EXAM: PELVIS - 1-2 VIEW COMPARISON:  None. FINDINGS: Both hips are normally located. No acute hip fracture or plain film evidence of AVN. The pubic symphysis and SI joints are intact. No definite pelvic fractures. IMPRESSION: No acute bony findings. Electronically Signed   By: Marijo Sanes M.D.   On: 07/20/2019 05:06   DG Elbow Complete Right  Result Date: 07/20/2019 CLINICAL DATA:  [place]. Right elbow pain. EXAM: RIGHT ELBOW - COMPLETE 3+ VIEW COMPARISON:  None. FINDINGS: The joint spaces are maintained. No acute fracture or joint effusion. There is a bandage with associated artifact over the proximal forearm dorsally. IMPRESSION: No acute bony findings or joint effusion. Electronically Signed   By: Marijo Sanes M.D.   On: 07/20/2019 05:07   CT Head Wo Contrast  Result Date: 07/20/2019 CLINICAL DATA:  Alzheimer's disease with fall and arm injury. EXAM: CT HEAD WITHOUT CONTRAST CT CERVICAL SPINE WITHOUT CONTRAST TECHNIQUE: Multidetector CT imaging of the head and cervical spine was performed following the standard protocol without intravenous contrast. Multiplanar CT image reconstructions of the cervical spine were also generated. COMPARISON:  05/17/2018 FINDINGS: CT HEAD FINDINGS Brain: No evidence of acute infarction, hemorrhage, hydrocephalus, extra-axial collection or mass lesion/mass effect. Advanced brain atrophy, especially the temporal lobes, correlating with history of Alzheimer's disease. Vascular: No hyperdense vessel or unexpected calcification. Skull: Mild right parietal scalp swelling.  No calvarial fracture. Sinuses/Orbits: No evidence of injury.  Right cataract resection. CT CERVICAL SPINE FINDINGS Alignment: No traumatic malalignment Skull base and vertebrae: Negative for fracture Soft tissues and spinal canal: No prevertebral fluid or swelling. No visible canal hematoma. Disc levels:  Ordinary degenerative changes Upper chest: Negative  IMPRESSION: 1. No evidence of acute intracranial or cervical spine injury. 2. Mild right posterior scalp contusion.  No calvarial  fracture. 3. Brain atrophy in keeping with history of Alzheimer's disease. Electronically Signed   By: Monte Fantasia M.D.   On: 07/20/2019 04:38   CT Cervical Spine Wo Contrast  Result Date: 07/20/2019 CLINICAL DATA:  Alzheimer's disease with fall and arm injury. EXAM: CT HEAD WITHOUT CONTRAST CT CERVICAL SPINE WITHOUT CONTRAST TECHNIQUE: Multidetector CT imaging of the head and cervical spine was performed following the standard protocol without intravenous contrast. Multiplanar CT image reconstructions of the cervical spine were also generated. COMPARISON:  05/17/2018 FINDINGS: CT HEAD FINDINGS Brain: No evidence of acute infarction, hemorrhage, hydrocephalus, extra-axial collection or mass lesion/mass effect. Advanced brain atrophy, especially the temporal lobes, correlating with history of Alzheimer's disease. Vascular: No hyperdense vessel or unexpected calcification. Skull: Mild right parietal scalp swelling.  No calvarial fracture. Sinuses/Orbits: No evidence of injury.  Right cataract resection. CT CERVICAL SPINE FINDINGS Alignment: No traumatic malalignment Skull base and vertebrae: Negative for fracture Soft tissues and spinal canal: No prevertebral fluid or swelling. No visible canal hematoma. Disc levels:  Ordinary degenerative changes Upper chest: Negative IMPRESSION: 1. No evidence of acute intracranial or cervical spine injury. 2. Mild right posterior scalp contusion.  No calvarial fracture. 3. Brain atrophy in keeping with history of Alzheimer's disease. Electronically Signed   By: Monte Fantasia M.D.   On: 07/20/2019 04:38   DG Hand Complete Right  Result Date: 07/20/2019 CLINICAL DATA:  [place]. Right hand pain. EXAM: RIGHT HAND - COMPLETE 3+ VIEW COMPARISON:  None. FINDINGS: Moderate to advanced degenerative changes most notably at the D IP joints of the fingers  and the [facility] joint of the thumb with findings likely reflecting erosive OA. The MCP joints are maintained. The radiocarpal and intercarpal joint spaces are maintained. No acute fracture is identified. IMPRESSION: 1. No acute bony findings. 2. Moderate to advanced degenerative changes as discussed above. Electronically Signed   By: Marijo Sanes M.D.   On: 07/20/2019 05:09   Procedures Procedures   Medications Ordered in ED Medications  Tdap (BOOSTRIX) injection 0.5 mL (0.5 mLs Intramuscular Given 07/20/19 0400)  lidocaine-EPINEPHrine-tetracaine (LET) topical gel (6 mLs Topical Given 07/20/19 0403)  fentaNYL (SUBLIMAZE) injection 50 mcg (50 mcg Intramuscular Given 07/20/19 0549)  LORazepam (ATIVAN) injection 0.5 mg (0.5 mg Intramuscular Given 07/20/19 0555)  LORazepam (ATIVAN) injection 0.5 mg (0.5 mg Intramuscular Given 07/20/19 [id number])    ED Course  I have reviewed the triage vital signs and the nursing notes.  Pertinent labs & imaging results that were available during my care of the patient were reviewed by me and considered in my medical decision making (see chart for details).    MDM Rules/Calculators/A&P                      3:46 AM patient is a 86 year old Caucasian female with history of dementia that presents status post a fall that occurred just prior to arrival.  Per EMS, they were told by nursing home staff that she is at her current baseline and is not on anticoagulation.  She is a level 5 Caveat due to dementia.  Will obtain CT scan of the head and neck, x-rays of the right arm, right hand, chest, thoracic spine, pelvis.  Tdap updated.  Will have wounds on her right arm cleaned and will apply LET.  5:41 AM x rays show compression deformity of T12 with a vertebral plana appearance, age indeterminate. Due to location of abrasion am concerned this is an acute change.  Will  provide outpatient ortho referral. No intracranial or cervical spine abnormalities noted.  C-collar removed.  Pt is  continuously trying to leave her bed and remove the bandaging on her arm. Will give fentanyl and ativan for pain management and will place under soft restraints.  6:53 AM Wound closure performed with quick clot bandage and a pressure dressing. Will transfer patient care over to morning provider for observation this morning to monitor bandaging and resolution of bleeding from the right arm. Discussed consideration of TXA if sx persist. Also discussed course of keflex for infection coverage.   Final Clinical Impression(s) / ED Diagnoses Final diagnoses:  Fall, initial encounter  Laceration of right upper extremity, initial encounter    Rx / DC Orders ED Discharge Orders    None       Rayna Sexton, PA-C 07/20/19 0741    Ezequiel Essex, MD 07/20/19 [phone]

## 2019-07-20 NOTE — ED Notes (Signed)
[facility] at bedside for transport, [facility] [address] notified of patient's return.

## 2019-07-20 NOTE — ED Provider Notes (Signed)
  Physical Exam  BP (!) 187/82 (BP Location: Left Arm)   Pulse (!) 52   Resp 16   Ht 5\' 7"  (1.702 m)   Wt 77 kg   SpO2 97%   BMI 26.59 kg/m   Physical Exam  ED Course/Procedures     Procedures  MDM  Patient care assumed from [place] PA at shift change, please see her note for a full HPI. Patient is s/p fall, had wound to the right arm which had a pressure dressing applied along with quick clot. Plan is monitoring wound, recheck in 1 hour in order to verify bleeding has stopped.  8:55 AM Patient was reevaluated by me, no bleeding through the dressing, bleeding is controlled at this time.  Patient is stable to be discharged back to the facility.    BP (!) 187/82 (BP Location: Left Arm)   Pulse (!) 52   Resp 16   Ht 5\' 7"  (1.702 m)   Wt 77 kg   SpO2 97%   BMI 26.59 kg/m    Portions of this note were generated with [employer]. Dictation errors may occur despite best attempts at proofreading.         Janeece Fitting, PA-C 07/20/19 [id number]    Lacretia Leigh, MD 07/21/19 [phone]

## 2019-07-20 NOTE — ED Triage Notes (Signed)
Pt presents by [facility] from [facility] [address] for evaluation of fall and bleeding to right forearm. EMS advises that staff heard pt calling out and found her in floor from wheelchair. Hx of alzheimer's disease unknown LOC. Pt in C-collar by EMS.

## 2019-09-14 ENCOUNTER — Other Ambulatory Visit: Payer: Self-pay

## 2019-09-14 ENCOUNTER — Encounter (HOSPITAL_COMMUNITY): Payer: Self-pay

## 2019-09-14 ENCOUNTER — Emergency Department (HOSPITAL_COMMUNITY)

## 2019-09-14 ENCOUNTER — Ambulatory Visit (HOSPITAL_COMMUNITY): Payer: Medicare Other

## 2019-09-14 ENCOUNTER — Emergency Department (HOSPITAL_COMMUNITY)
Admission: EM | Admit: 2019-09-14 | Discharge: 2019-09-14 | Disposition: A | Discharge status: Home / Self Care | Attending: Emergency Medicine | Admitting: Emergency Medicine

## 2019-09-14 DIAGNOSIS — Y939 Activity, unspecified: Secondary | ICD-10-CM | POA: Insufficient documentation

## 2019-09-14 DIAGNOSIS — N182 Chronic kidney disease, stage 2 (mild): Secondary | ICD-10-CM | POA: Diagnosis not present

## 2019-09-14 DIAGNOSIS — Y92129 Unspecified place in nursing home as the place of occurrence of the external cause: Secondary | ICD-10-CM | POA: Insufficient documentation

## 2019-09-14 DIAGNOSIS — F0391 Unspecified dementia with behavioral disturbance: Secondary | ICD-10-CM | POA: Insufficient documentation

## 2019-09-14 DIAGNOSIS — S61412A Laceration without foreign body of left hand, initial encounter: Secondary | ICD-10-CM | POA: Diagnosis present

## 2019-09-14 DIAGNOSIS — Z79899 Other long term (current) drug therapy: Secondary | ICD-10-CM | POA: Diagnosis not present

## 2019-09-14 DIAGNOSIS — Z87891 Personal history of nicotine dependence: Secondary | ICD-10-CM | POA: Diagnosis not present

## 2019-09-14 DIAGNOSIS — I129 Hypertensive chronic kidney disease with stage 1 through stage 4 chronic kidney disease, or unspecified chronic kidney disease: Secondary | ICD-10-CM | POA: Insufficient documentation

## 2019-09-14 DIAGNOSIS — W19XXXA Unspecified fall, initial encounter: Secondary | ICD-10-CM | POA: Diagnosis not present

## 2019-09-14 DIAGNOSIS — Y999 Unspecified external cause status: Secondary | ICD-10-CM | POA: Insufficient documentation

## 2019-09-14 LAB — BASIC METABOLIC PANEL
Anion gap: 8 (ref 5–15)
BUN: 29 mg/dL — ABNORMAL HIGH (ref 8–23)
CO2: 26 mmol/L (ref 22–32)
Calcium: 8.8 mg/dL — ABNORMAL LOW (ref 8.9–10.3)
Chloride: 105 mmol/L (ref 98–111)
Creatinine, Ser: 0.91 mg/dL (ref 0.44–1.00)
GFR calc Af Amer: >60 mL/min (ref >60)
GFR calc non Af Amer: 58 mL/min — ABNORMAL LOW (ref >60)
Glucose, Bld: 99 mg/dL (ref 70–99)
Potassium: 4.6 mmol/L (ref 3.5–5.1)
Sodium: 139 mmol/L (ref 135–145)

## 2019-09-14 LAB — CBC
HCT: 34.6 % — ABNORMAL LOW (ref 36.0–46.0)
Hemoglobin: 10.8 g/dL — ABNORMAL LOW (ref 12.0–15.0)
MCH: 31 pg (ref 26.0–34.0)
MCHC: 31.2 g/dL (ref 30.0–36.0)
MCV: 99.4 fL (ref 80.0–100.0)
Platelets: 152 10*3/uL (ref 150–400)
RBC: 3.48 MIL/uL — ABNORMAL LOW (ref 3.87–5.11)
RDW: 13.1 % (ref 11.5–15.5)
WBC: 4.9 10*3/uL (ref 4.0–10.5)
nRBC: 0 % (ref 0.0–0.2)

## 2019-09-14 MED ORDER — LORAZEPAM 1 MG PO TABS
1.0000 mg | ORAL_TABLET | Freq: Once | ORAL | Status: AC
  Administered 2019-09-14: 1 mg via ORAL
  Filled 2019-09-14: qty 1

## 2019-09-14 NOTE — ED Notes (Signed)
Physician at pt bedside

## 2019-09-14 NOTE — ED Notes (Signed)
Radiology has attempted scans twice but have been unsuccessful due to pt inability to cooperate at this time. Pt has had 1mg  of ativan p.o. will continue to monitor

## 2019-09-14 NOTE — ED Notes (Signed)
[facility] called for pt transport back to facility ?

## 2019-09-14 NOTE — ED Notes (Signed)
[facility] at bedside 

## 2019-09-14 NOTE — ED Triage Notes (Signed)
Pt BIBA from [facility]. Pt has hx of dementia. Pt had unwitnessed fall. Pt is not on blood thinners. Pt is at baseline per SNF staff. Pt has c collar in place from EMS.   EMS VS: 152/92 98  18 98% RA 97.8

## 2019-09-14 NOTE — Discharge Instructions (Signed)
Recommend follow-up with primary doctor as needed.  Return to ER as needed for further falls or other concerning condition.  Keep hand wound clean and dry, recommend routine wound care for the skin tear.

## 2019-09-15 NOTE — ED Provider Notes (Signed)
[facility] DEPT Provider Note   CSN: [id number] Arrival date & time: 09/14/19  1635     History Chief Complaint  Patient presents with  . Fall    Christina Atkinson is a 86 y.o. female.  Severe dementia, currently enrolled in hospice care.  Unwitnessed fall per report.  Noted skin tear to left hand but no other trauma was noted by facility.  Additional history obtained via daughter, patient had significant decline and was placed in hospice care but has now been on hospice for around 1 year.  Had significant falls at the beginning of placement in memory care but fewer falls recently.  DNR. Not on blood thinners.  Level 5 limited due to dementia   HPI     Past Medical History:  Diagnosis Date  . Alzheimer's dementia with behavioral disturbance ([place])    per Dr. Marco Collie 08/2015  . Anxiety   . Chronic diarrhea   . CKD (chronic kidney disease), stage II    per medical record per Dr. Marco Collie   . Colon polyps   . Dementia ([place])   . HTN (hypertension)     Patient Active Problem List   Diagnosis Date Noted  . History of colon polyps 08/15/2016  . CKD (chronic kidney disease), stage II   . Alzheimer's dementia with behavioral disturbance ([place])   . HTN (hypertension) 01/23/2016  . Dementia ([place]) 01/23/2016  . Chronic diarrhea of unknown origin 01/23/2016  . Rhinorrhea 01/23/2016    Past Surgical History:  Procedure Laterality Date  . CESAREAN SECTION    . TONSILLECTOMY       OB History   No obstetric history on file.     Family History  Problem Relation Age of Onset  . Heart attack Father   . Alzheimer's disease Sister     Social History   Tobacco Use  . Smoking status: Former Smoker    Quit date: 09/14/1986    Years since quitting: 33.0  . Smokeless tobacco: Never Used  Substance Use Topics  . Alcohol use: No  . Drug use: No    Home Medications Prior to Admission medications   Medication Sig Start Date End Date  Taking? Authorizing Provider  acetaminophen (TYLENOL) 325 MG tablet Take 325 mg by mouth 2 (two) times daily. Knee pain    [provider]  atenolol-chlorthalidone (TENORETIC) 100-25 MG tablet TAKE 1 TABLET BY MOUTH EVERY DAY 01/16/17   Henson, Vickie L, NP-C  bacitracin ointment Apply 1 application topically 2 (two) times daily. 05/17/18   Varney Biles, MD  Budesonide 9 MG TB24 Take 1 tablet by mouth daily. 10/12/16   Doran Stabler, MD  Calcium Carbonate-Vitamin D3 (CALCIUM 600/VITAMIN D) 600-400 MG-UNIT TABS Take 1 tablet by mouth 2 (two) times daily.    [provider]  diclofenac sodium (VOLTAREN) 1 % GEL Apply 4 g topically 3 (three) times daily. To the right knww     [provider]  donepezil (ARICEPT) 10 MG tablet TAKE 1 TABLET BY MOUTH ONCE DAILY 04/09/17   Star Age, MD  escitalopram (LEXAPRO) 10 MG tablet Take 10 mg by mouth daily.    [provider]  Glucosamine 500 MG CAPS Take 1,500 mg by mouth daily.    [provider]  latanoprost (XALATAN) 0.005 % ophthalmic solution Place 1 drop into both eyes at bedtime.    [provider]  lisinopril (PRINIVIL,ZESTRIL) 5 MG tablet Take 5 mg by mouth daily.  [provider]  LORazepam (ATIVAN) 0.5 MG tablet Take 0.5 mg by mouth 2 (two) times daily.    [provider]  LORazepam (ATIVAN) 0.5 MG tablet Take 0.25 mg by mouth 2 (two) times daily as needed for anxiety.    [provider]  Melatonin 10 MG CAPS Take 10 mg by mouth at bedtime.    [provider]  memantine (NAMENDA XR) 14 MG CP24 24 hr capsule Take 1 capsule (14 mg total) by mouth daily. 11/08/16   Star Age, MD  memantine (NAMENDA) 10 MG tablet Take 5-10 mg by mouth 2 (two) times daily.     [provider]  polyethylene glycol (MIRALAX / GLYCOLAX) packet Take 17 g by mouth daily as needed for mild constipation.    [provider]  traMADol (ULTRAM) 50 MG tablet Take 50  mg by mouth 2 (two) times daily.    [provider]  vitamin B-12 (CYANOCOBALAMIN) 100 MCG tablet Take 100 mcg by mouth daily.    [provider]    Allergies    Codeine  Review of Systems   Review of Systems  Unable to perform ROS: Dementia    Physical Exam Updated Vital Signs BP (!) 170/117   Pulse (!) 103   Temp 98.6 F (37 C) (Oral)   Resp 18   SpO2 99%   Physical Exam Vitals and nursing note reviewed.  Constitutional:      General: She is not in acute distress.    Appearance: She is well-developed.     Comments: Pleasantly demented  HENT:     Head: Normocephalic and atraumatic.  Eyes:     Conjunctiva/sclera: Conjunctivae normal.  Cardiovascular:     Rate and Rhythm: Normal rate and regular rhythm.     Heart sounds: No murmur.  Pulmonary:     Effort: Pulmonary effort is normal. No respiratory distress.     Breath sounds: Normal breath sounds.  Abdominal:     Palpations: Abdomen is soft.     Tenderness: There is no abdominal tenderness.  Musculoskeletal:     Cervical back: Neck supple.     Comments: Back: no C, T, L spine TTP, no step off or deformity RUE: no TTP throughout, no deformity, normal joint ROM, radial pulse intact, distal sensation and motor intact LUE: There is 1 cm superficial skin tear over palmar aspect of left hand, no TTP throughout, normal joint ROM, radial pulse intact, distal sensation and motor intact RLE:  no TTP throughout, no deformity, normal joint ROM, distal pulse, sensation and motor intact LLE: no TTP throughout, no deformity, normal joint ROM, distal pulse, sensation and motor intact  Skin:    General: Skin is warm and dry.  Neurological:     General: No focal deficit present.     Mental Status: She is alert.     Comments: Pleasantly demented     ED Results / Procedures / Treatments   Labs (all labs ordered are listed, but only abnormal results are displayed) Labs Reviewed  CBC - Abnormal; Notable for the  following components:      Result Value   RBC 3.48 (*)    Hemoglobin 10.8 (*)    HCT 34.6 (*)    All other components within normal limits  BASIC METABOLIC PANEL - Abnormal; Notable for the following components:   BUN 29 (*)    Calcium 8.8 (*)    GFR calc non Af Amer 58 (*)  All other components within normal limits    EKG None  Radiology DG Chest 1 View  Result Date: 09/14/2019 CLINICAL DATA:  Un witnessed fall, hypertension, dimension EXAM: CHEST  1 VIEW COMPARISON:  07/20/2019 FINDINGS: Single frontal view of the chest was obtained with the patient rotated toward the right. The cardiac silhouette is unremarkable. No airspace disease, effusion, or pneumothorax. No acute bony abnormalities. IMPRESSION: 1. No acute intrathoracic process. Electronically Signed   By: Randa Ngo M.D.   On: 09/14/2019 17:34   CT Head Wo Contrast  Result Date: 09/14/2019 CLINICAL DATA:  Dementia, un witnessed fall EXAM: CT HEAD WITHOUT CONTRAST TECHNIQUE: Contiguous axial images were obtained from the base of the skull through the vertex without intravenous contrast. COMPARISON:  07/20/2019 FINDINGS: Brain: No acute infarct or hemorrhage. Lateral ventricles and midline structures are stable. There is diffuse cerebral atrophy, age-appropriate. No acute extra-axial fluid collections. No mass effect. Vascular: No hyperdense vessel or unexpected calcification. Skull: Normal. Negative for fracture or focal lesion. Sinuses/Orbits: No acute finding. Other: None. IMPRESSION: 1. Stable exam, no acute process. Electronically Signed   By: Randa Ngo M.D.   On: 09/14/2019 18:41   CT Cervical Spine Wo Contrast  Result Date: 09/14/2019 CLINICAL DATA:  Dementia, unwitnessed fall EXAM: CT CERVICAL SPINE WITHOUT CONTRAST TECHNIQUE: Multidetector CT imaging of the cervical spine was performed without intravenous contrast. Multiplanar CT image reconstructions were also generated. COMPARISON:  07/20/2019 FINDINGS:  Alignment: Alignment is anatomic. Skull base and vertebrae: No acute displaced fractures. Soft tissues and spinal canal: No prevertebral fluid or swelling. No visible canal hematoma. Disc levels: Disc spaces are relatively well preserved. Prominent facet hypertrophy at C2-3 and C3-4. Upper chest: Airway is patent.  Lung apices are clear. Other: Reconstructed images demonstrate no additional findings. IMPRESSION: 1. No acute cervical spine fracture. Electronically Signed   By: Randa Ngo M.D.   On: 09/14/2019 18:42    Procedures Procedures (including critical care time)  Medications Ordered in ED Medications  LORazepam (ATIVAN) tablet 1 mg (1 mg Oral Given 09/14/19 1741)    ED Course  I have reviewed the triage vital signs and the nursing notes.  Pertinent labs & imaging results that were available during my care of the patient were reviewed by me and considered in my medical decision making (see chart for details).    MDM Rules/Calculators/A&P                      86 year old lady with severe dementia presented to ER after unwitnessed fall from memory care center.  Here she is at baseline, vital signs stable.  Trauma assessment, noted skin tear on her left hand but no other acute trauma identified.  CT head, C-spine were negative.  Screening CXR negative.  Basic labs within normal limits.  Patient will be discharged back to facility.    After the discussed management above, the patient was determined to be safe for discharge.  The patient was in agreement with this plan and all questions regarding their care were answered.  ED return precautions were discussed and the patient will return to the ED with any significant worsening of condition.   Final Clinical Impression(s) / ED Diagnoses Final diagnoses:  Fall, initial encounter  Skin tear of left hand without complication, initial encounter    Rx / DC Orders ED Discharge Orders    None       Lucrezia Starch, MD 09/15/19  [phone]

## 2020-02-22 DEATH — deceased

## 2020-05-16 IMAGING — CT CT HEAD W/O CM
3 series · 15 of 47 positions shown, 18 images · non-contrast
Comparison: 05/17/2018

CLINICAL DATA: Alzheimer's disease with fall and arm injury.

EXAM:
CT HEAD WITHOUT CONTRAST
CT CERVICAL SPINE WITHOUT CONTRAST
TECHNIQUE: Multidetector CT imaging of the head and cervical spine was
performed following the standard protocol without intravenous
contrast. Multiplanar CT image reconstructions of the cervical spine
were also generated.

[Series 3: head wo · axial · 0.43mm/px · z∈[-155,-15]mm · 9 of 34 slices shown, 12 images]
[im 3/34  brain]
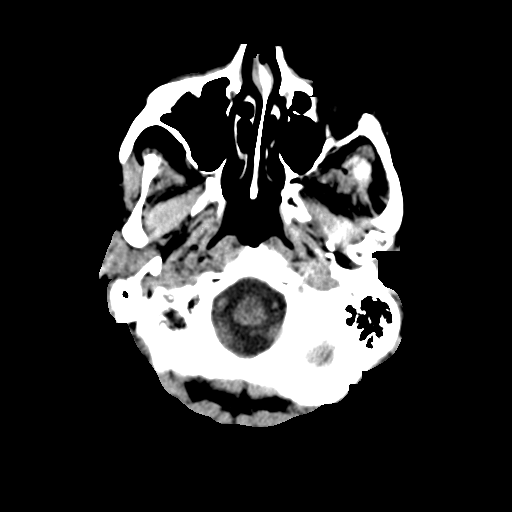
[im 3/34  bone]
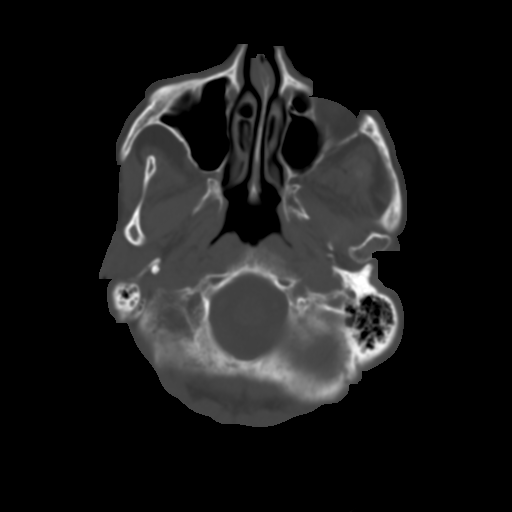
[im 6/34  brain]
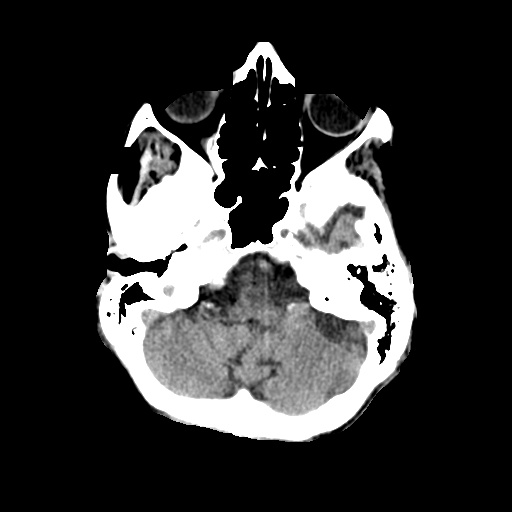
[im 10/34  brain]
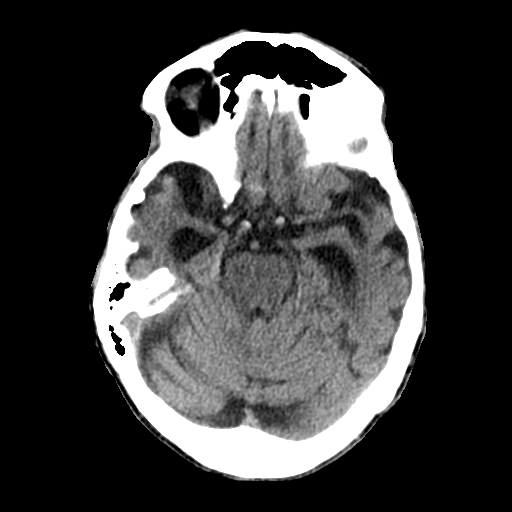
[im 13/34  brain]
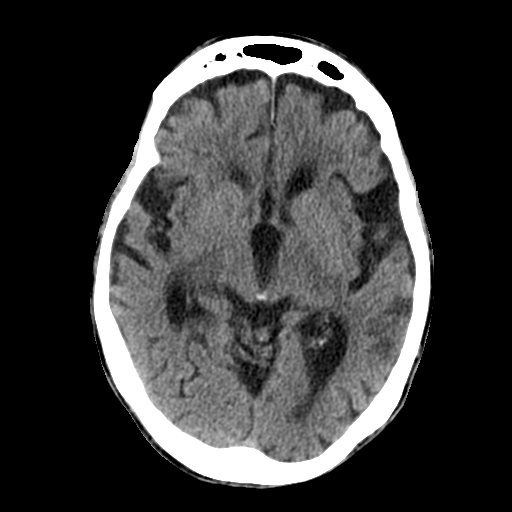
[im 18/34  brain]
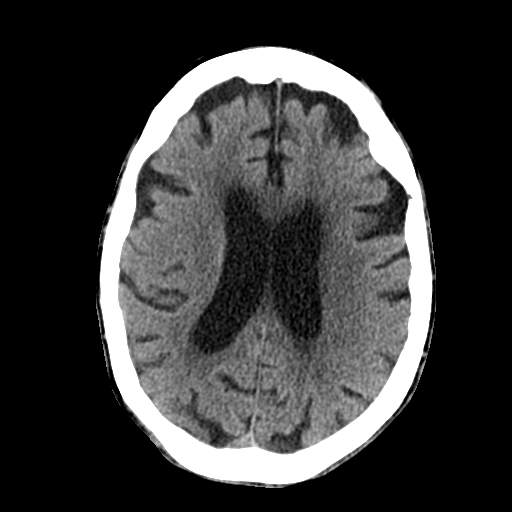
[im 18/34  bone]
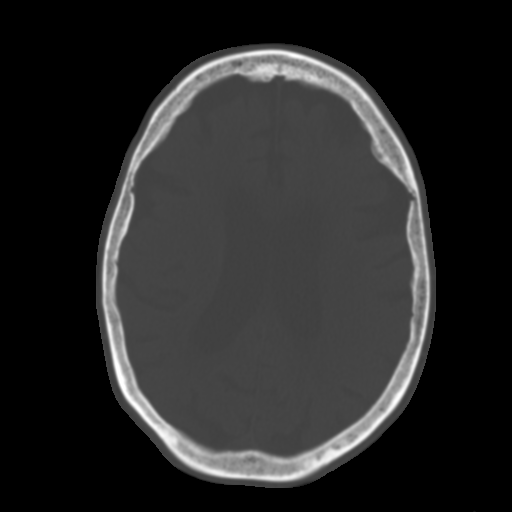
[im 21/34  brain]
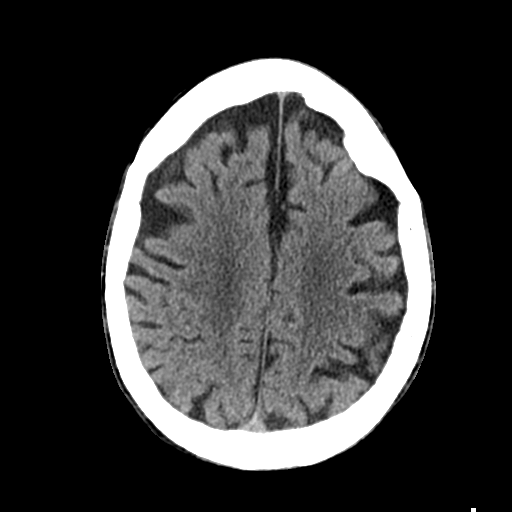
[im 24/34  brain]
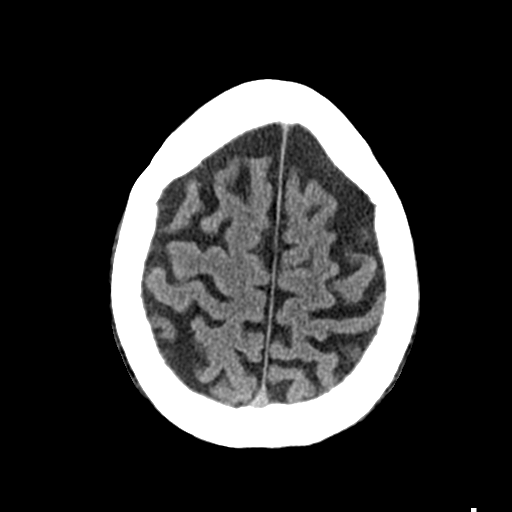
[im 28/34  brain]
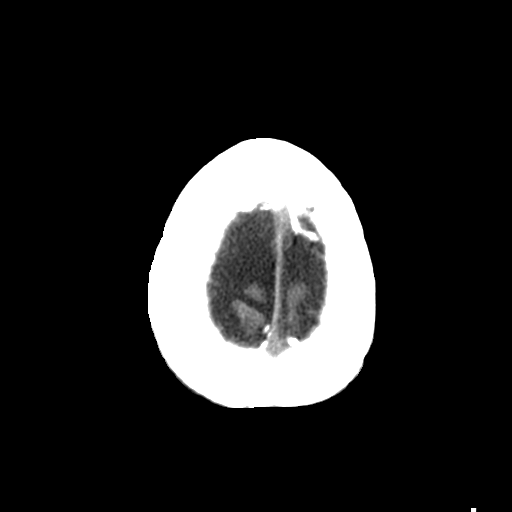
[im 31/34  brain]
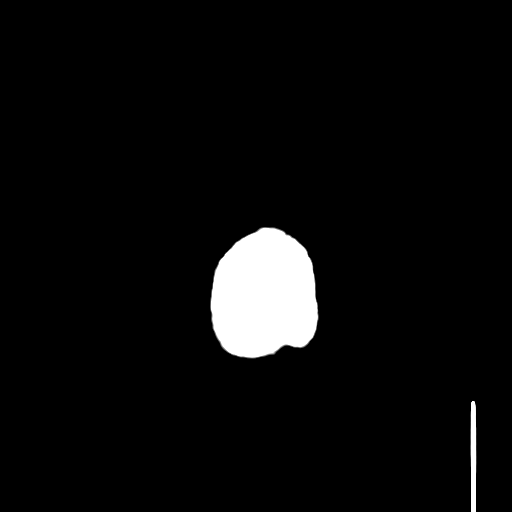
[im 31/34  bone]
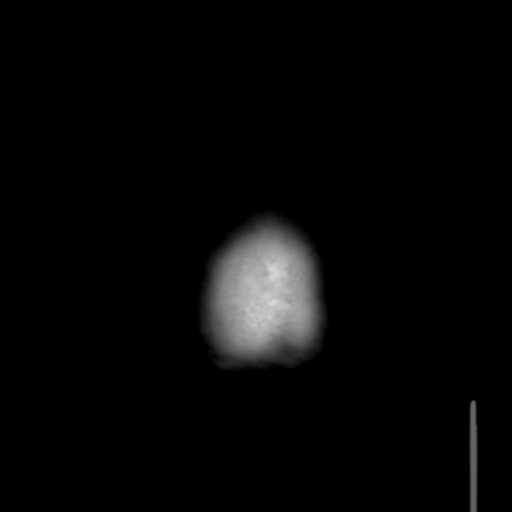

[Series 5: coronal soft tissue · coronal · 0.33mm/px · 3 of 73 slices shown]
[im 25/73  brain]
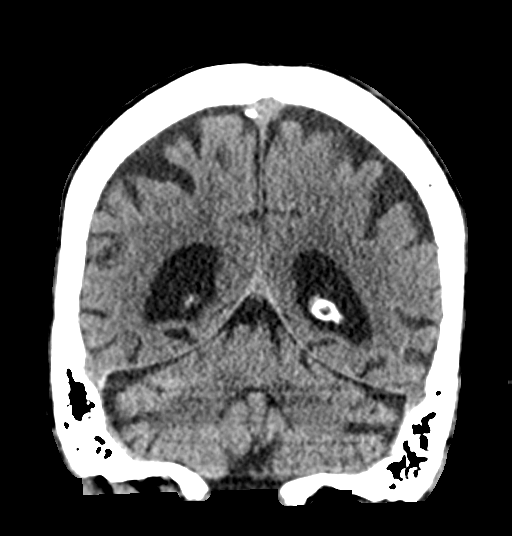
[im 33/73  brain]
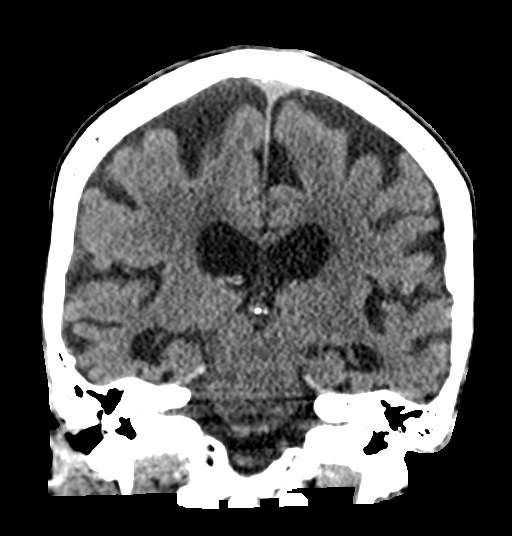
[im 41/73  brain]
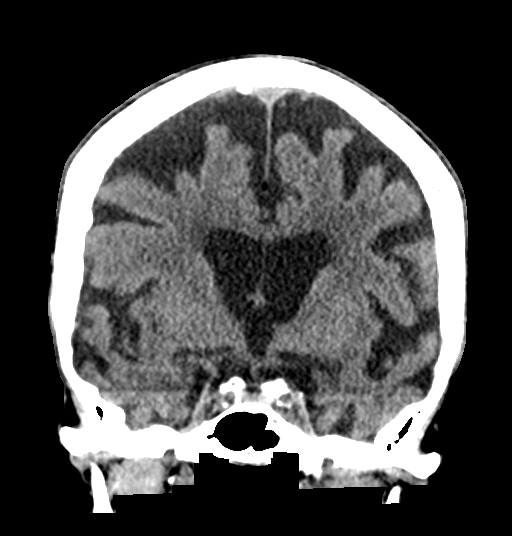

[Series 6: sagittal soft tissue · sagittal · 0.34mm/px · 3 of 56 slices shown]
[im 19/56  brain]
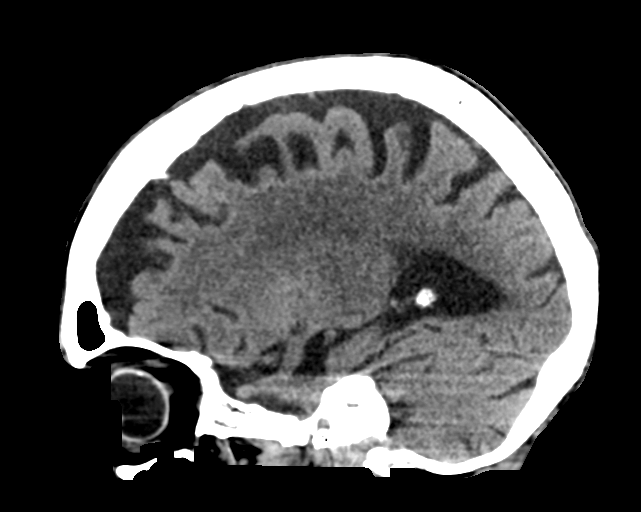
[im 28/56  brain]
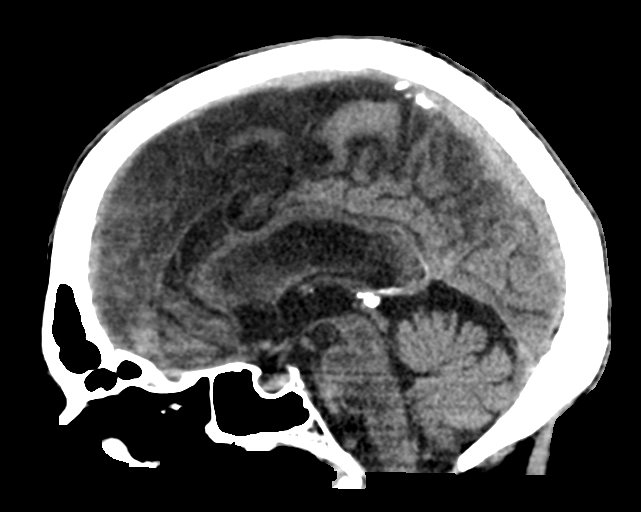
[im 37/56  brain]
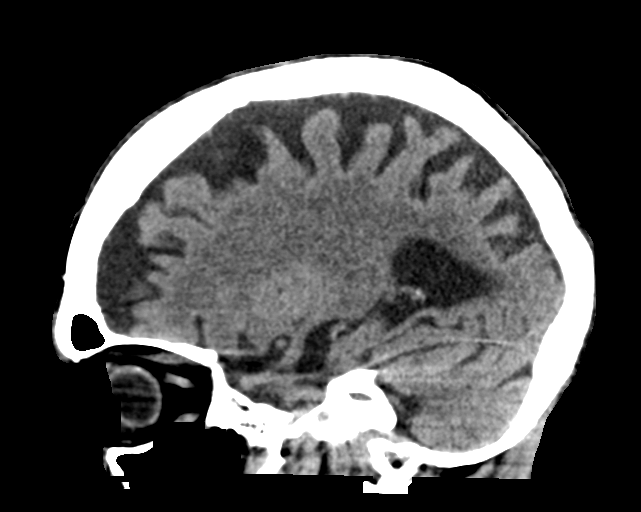

[15 of 47 positions shown; findings below may reference images not displayed]

FINDINGS: CT HEAD FINDINGS

Brain: No evidence of acute infarction, hemorrhage, hydrocephalus,
extra-axial collection or mass lesion/mass effect. Advanced brain
atrophy, especially the temporal lobes, correlating with history of
Alzheimer's disease.

Vascular: No hyperdense vessel or unexpected calcification.

Skull: Mild right parietal scalp swelling.  No calvarial fracture.

Sinuses/Orbits: No evidence of injury.  Right cataract resection.

CT CERVICAL SPINE FINDINGS

Alignment: No traumatic malalignment

Skull base and vertebrae: Negative for fracture

Soft tissues and spinal canal: No prevertebral fluid or swelling. No
visible canal hematoma.

Disc levels:  Ordinary degenerative changes

Upper chest: Negative
IMPRESSION: 1. No evidence of acute intracranial or cervical spine injury.
2. Mild right posterior scalp contusion.  No calvarial fracture.
3. Brain atrophy in keeping with history of Alzheimer's disease.

## 2020-05-16 IMAGING — CR DG CHEST 2V
2 series · 2 of 2 positions shown · non-contrast
Comparison: None.

CLINICAL DATA: Fall with leading to the right forearm.

EXAM:
CHEST - 2 VIEW

[w chest lat]
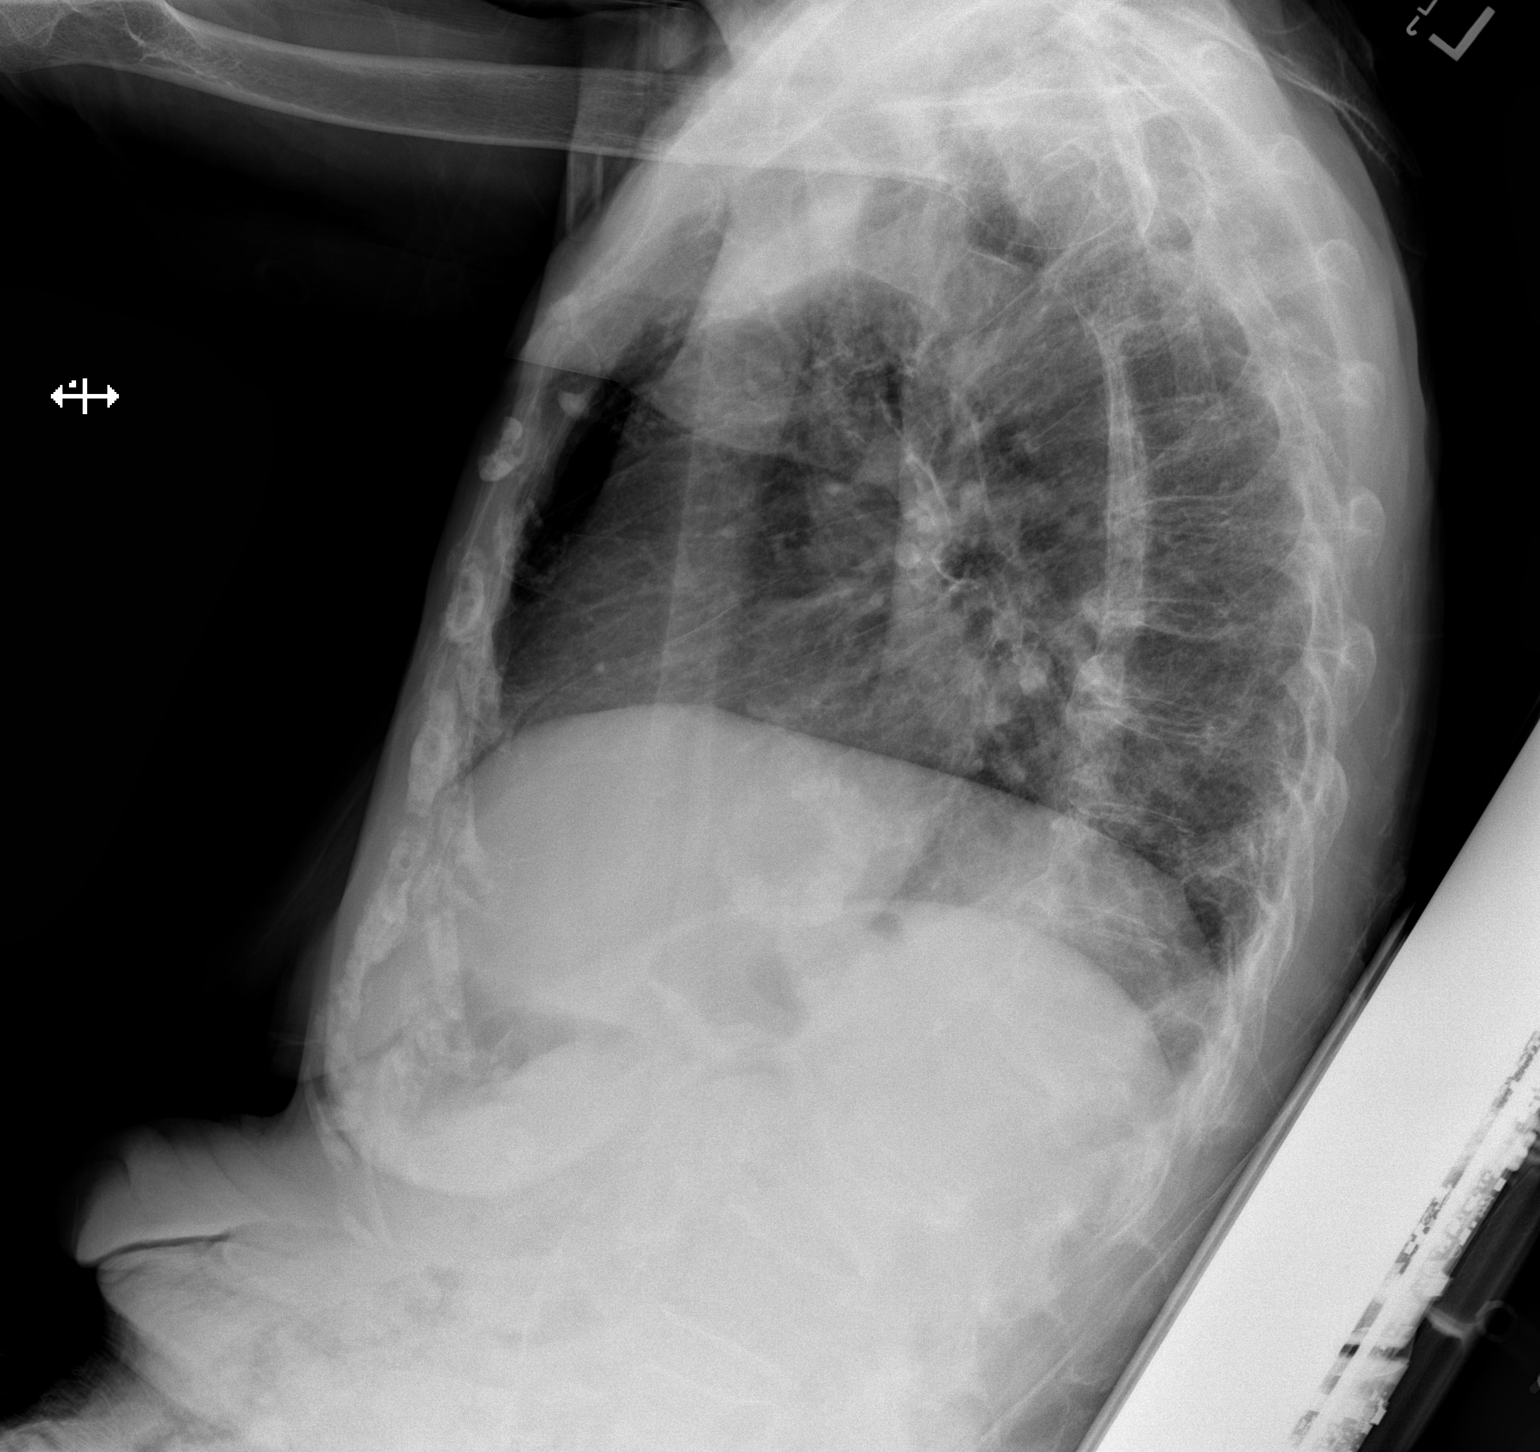

[x chest ap]
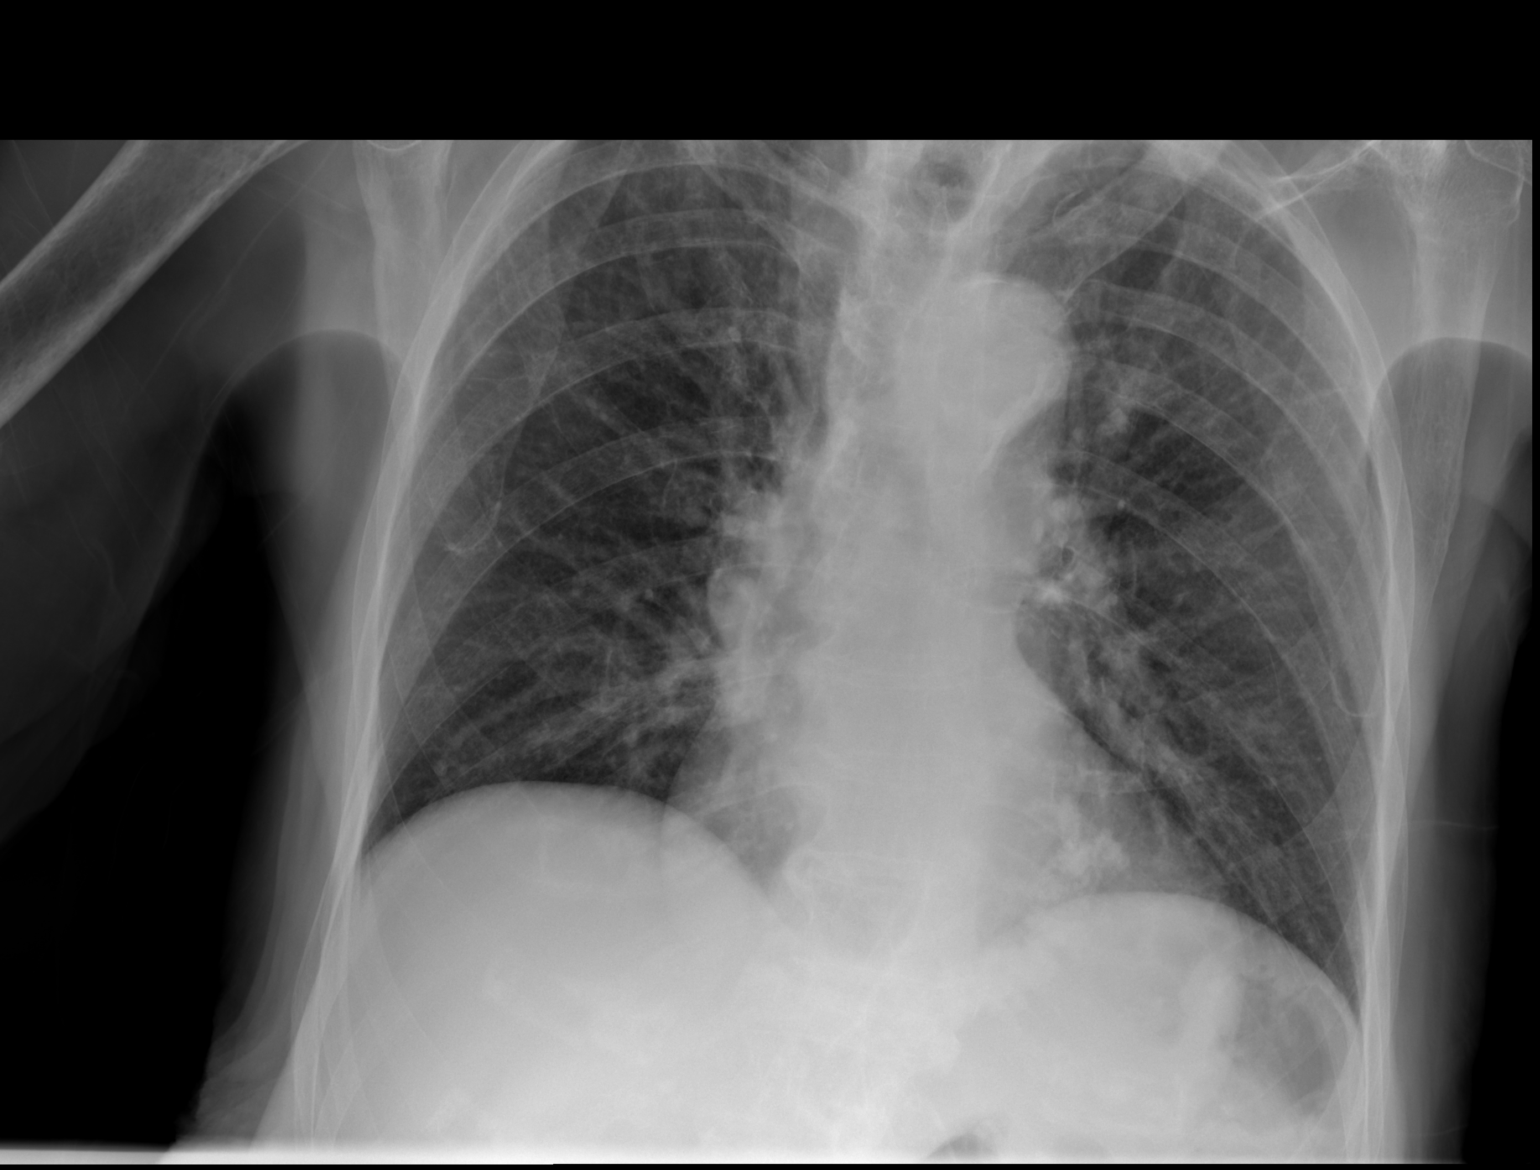

[2 of 2 positions shown; findings below may reference images not displayed]

FINDINGS: Borderline heart size with bulky mitral annular calcification. There
is no edema, consolidation, effusion, or pneumothorax.

Thoracic spine described separately.  No visible rib fracture.

Osteopenia.
IMPRESSION: No evidence of active cardiopulmonary disease.

## 2020-05-16 IMAGING — CR DG THORACIC SPINE 2V
2 series · 2 of 2 positions shown · non-contrast
Comparison: None.

CLINICAL DATA: Fell.

EXAM:
THORACIC SPINE 2 VIEWS

[w thoracic spine lat]
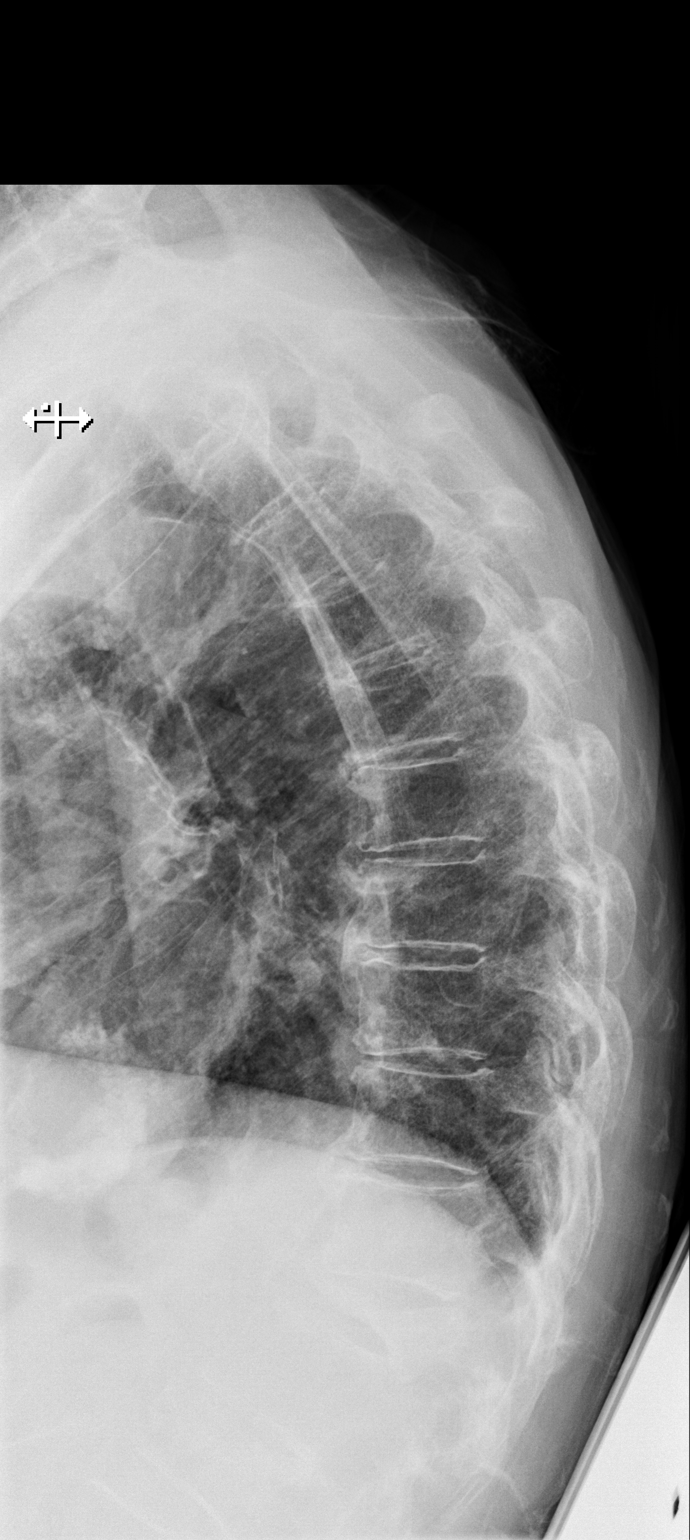

[x thoracic spine lat]
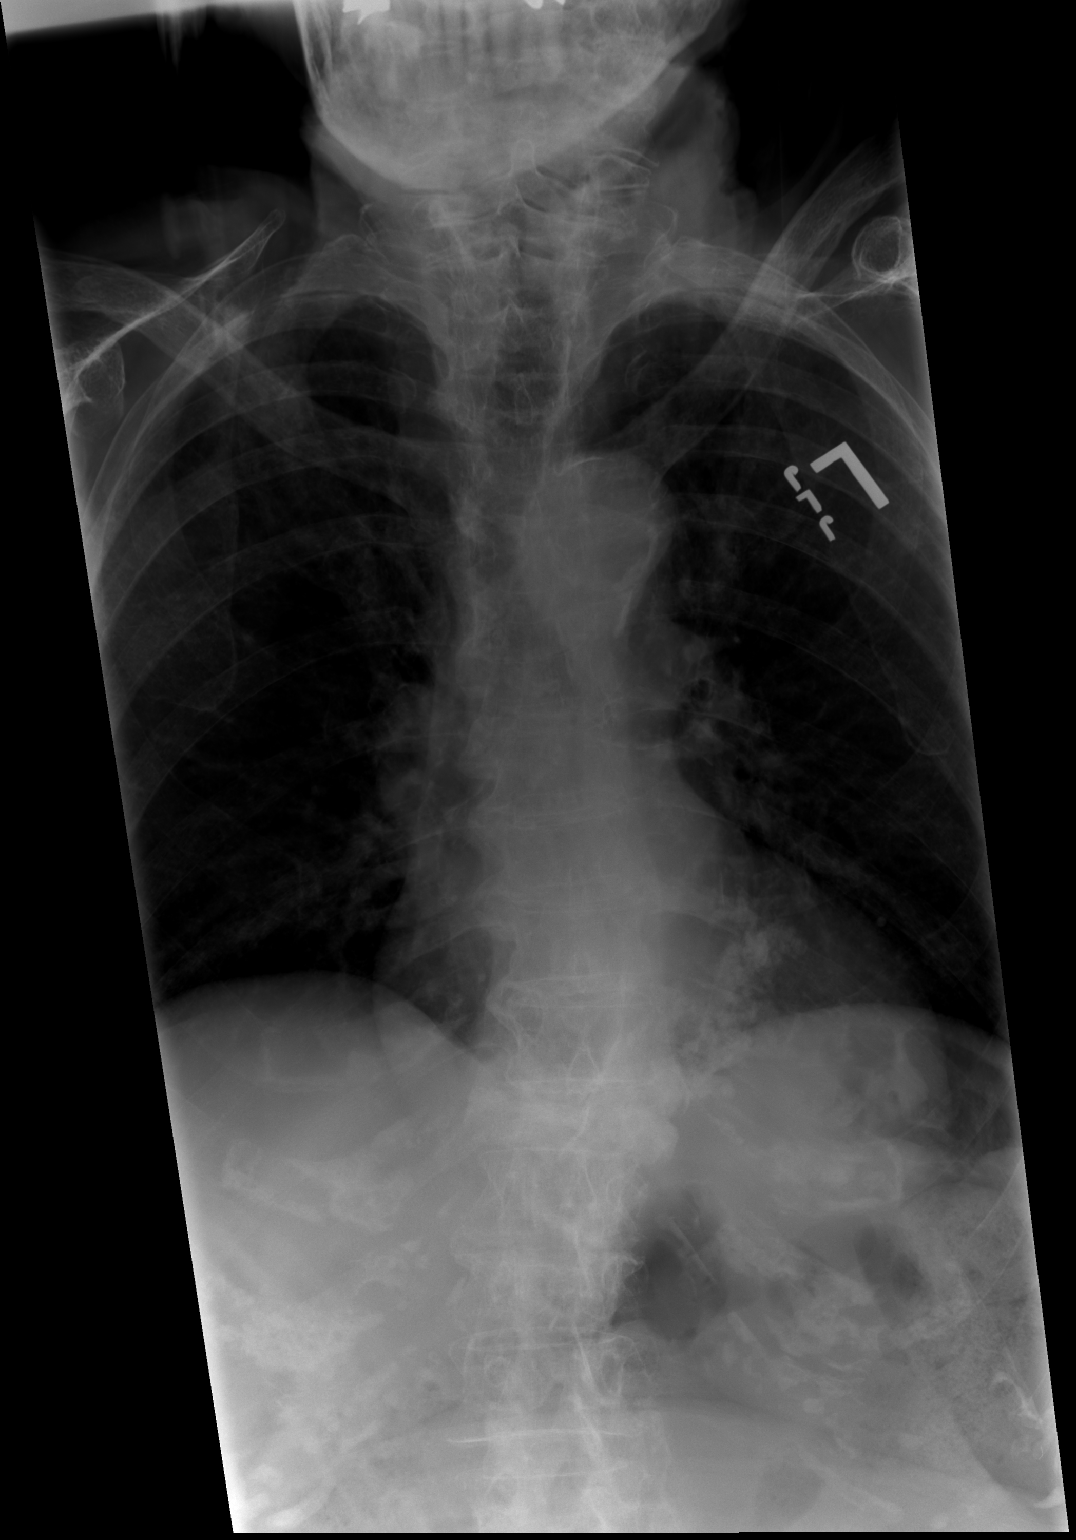

[2 of 2 positions shown; findings below may reference images not displayed]

FINDINGS: Thoracolumbar scoliosis and advanced degenerative spondylosis. There
is a compression deformity of T12 with a vertebral plana appearance.
This was not present on a prior MRI from 0925 and is age
indeterminate. There is a remote compression fracture of L2. The
other thoracic vertebral bodies appear normally aligned on the
lateral film. No other compression deformities are identified.

The visualized lungs are grossly clear.
IMPRESSION: 1. Compression deformity of T12 with a vertebral plana appearance,
age indeterminate.
2. Remote L2 compression fracture.
3. Scoliosis and advanced degenerative spondylosis of the thoracic
spine.
